# Patient Record
Sex: Male | Born: 1990 | Hispanic: Yes | Marital: Single | Smoking: Never smoker
Health system: Southern US, Community
[De-identification: ages and names within clinical notes are randomized; demographics above are authoritative.]

## PROBLEM LIST (undated history)

## (undated) DIAGNOSIS — R0600 Dyspnea, unspecified: Secondary | ICD-10-CM

## (undated) DIAGNOSIS — E0590 Thyrotoxicosis, unspecified without thyrotoxic crisis or storm: Secondary | ICD-10-CM

## (undated) HISTORY — PX: NO PAST SURGERIES: SHX2092

## (undated) HISTORY — DX: Thyrotoxicosis, unspecified without thyrotoxic crisis or storm: E05.90

---

## 2012-05-28 ENCOUNTER — Ambulatory Visit (INDEPENDENT_AMBULATORY_CARE_PROVIDER_SITE_OTHER): Payer: Self-pay | Admitting: Family Medicine

## 2012-05-28 VITALS — BP 142/78 | HR 105 | Temp 98.3°F | Resp 12 | Ht 68.5 in | Wt 176.0 lb

## 2012-05-28 DIAGNOSIS — R21 Rash and other nonspecific skin eruption: Secondary | ICD-10-CM

## 2012-05-28 DIAGNOSIS — L42 Pityriasis rosea: Secondary | ICD-10-CM

## 2012-05-28 LAB — POCT CBC
Granulocyte percent: 54.5 %G (ref 37–80)
MCV: 84.5 fL (ref 80–97)
MID (cbc): 0.7 (ref 0–0.9)
MPV: 10.4 fL (ref 0–99.8)
POC Granulocyte: 4 (ref 2–6.9)
POC LYMPH PERCENT: 36.7 %L (ref 10–50)
POC MID %: 8.8 %M (ref 0–12)
Platelet Count, POC: 413 10*3/uL (ref 142–424)
RDW, POC: 14.4 %

## 2012-05-28 LAB — POCT URINALYSIS DIPSTICK
Protein, UA: NEGATIVE
Spec Grav, UA: 1.02
Urobilinogen, UA: 0.2

## 2012-05-28 NOTE — Patient Instructions (Addendum)
So far your labs look fine.  I think that your rash is pityriasis rosea- a benign condition.  Let us know right away if you begin to feel ill, have a fever or any other complications.

## 2012-05-28 NOTE — Progress Notes (Addendum)
[facility] [address] [place] [address] [phone]  Date:  05/28/2012   Name:  Stephen Benson   DOB:  1990/08/09   MRN:  [id number]  PCP:  No primary provider on file.    Chief Complaint: Rash   History of Present Illness:  Stephen Benson is a 22 y.o. very pleasant male patient who presents with the following:  He is here today with a rash.  Today is Thursday. This past Saturday he was drinking some tequila and home- made moonshine out in his yard.  The next day he noted a rash on his feet, and then some other areas on his arms and trunk.  Most of the rash is not itchy- just one spot on his trunk is itchy.  No one else has the same rash, and he does not have any pets or any other new contacts that he can think of.  No one else seemed to become ill from the moonshine  Otherwise he is feeling ok He is generally healthy, although he has been told his BP was borderline about one year ago He is not taking any medications No major past medical history.    No fever, chills, or aches, or HA, no GI symptoms  No ST  There is no problem list on file for this patient.   History reviewed. No pertinent past medical history.  History reviewed. No pertinent past surgical history.  History  Substance Use Topics  . Smoking status: Never Smoker   . Smokeless tobacco: Not on file  . Alcohol Use: Yes    History reviewed. No pertinent family history.  Not on File  Medication list has been reviewed and updated.  No current outpatient prescriptions on file prior to visit.   No current facility-administered medications on file prior to visit.    Review of Systems:  As per HPI- otherwise negative.   Physical Examination: Filed Vitals:   05/28/12 1048  BP: 146/80  Pulse: 94  Temp: 98.3 F (36.8 C)  Resp: 12   Filed Vitals:   05/28/12 1048  Height: 5' 8.5" (1.74 m)  Weight: 176 lb (79.833 kg)   Body mass index is 26.37 kg/(m^2). Ideal Body  Weight: Weight in (lb) to have BMI = 25: 166.5  GEN: WDWN, NAD, Non-toxic, A & O x 3, appears well HEENT: Atraumatic, Normocephalic. Neck supple. No masses, No LAD.  Bilateral TM wnl, oropharynx normal.  PEERL,EOMI.   Ears and Nose: No external deformity. CV: RRR, No M/G/R. No JVD. No thrill. No extra heart sounds. PULM: CTA B, no wheezes, crackles, rhonchi. No retractions. No resp. distress. No accessory muscle use. ABD: S, NT, ND EXTR: No c/c/e NEURO Normal gait.  PSYCH: Normally interactive. Conversant. Not depressed or anxious appearing.  Calm demeanor.  Rash: on his trunk is a rash that appears typical of pityriasis rosea.  The palms and soles are spared.  On the dorsal feet are a few tiny petechiae and some flat, salmon macules also like pityriasis rosea.  No rash on the wrists  Results for orders placed in visit on 05/28/12  POCT CBC      Result Value Range   WBC 7.4  4.6 - 10.2 K/uL   Lymph, poc 2.7  0.6 - 3.4   POC LYMPH PERCENT 36.7  10 - 50 %L   MID (cbc) 0.7  0 - 0.9   POC MID % 8.8  0 - 12 %M   POC  Granulocyte 4.0  2 - 6.9   Granulocyte percent 54.5  37 - 80 %G   RBC 5.62  4.69 - 6.13 M/uL   Hemoglobin 16.0  14.1 - 18.1 g/dL   HCT, POC 40.9  81.1 - 53.7 %   MCV 84.5  80 - 97 fL   MCH, POC 28.5  27 - 31.2 pg   MCHC 33.7  31.8 - 35.4 g/dL   RDW, POC 91.4     Platelet Count, POC 413  142 - 424 K/uL   MPV 10.4  0 - 99.8 fL  POCT URINALYSIS DIPSTICK      Result Value Range   Color, UA yellow     Clarity, UA clear     Glucose, UA neg     Bilirubin, UA neg     Ketones, UA neg     Spec Grav, UA 1.020     Blood, UA neg     pH, UA 8.0     Protein, UA neg     Urobilinogen, UA 0.2     Nitrite, UA neg     Leukocytes, UA Negative      Assessment and Plan: Rash and nonspecific skin eruption - Plan: POCT CBC, POCT urinalysis dipstick, Comprehensive metabolic panel  Pityriasis rosea  Suspect that Brennon has a type of pityriasis rash today.  Labs so far are reassuring,  await CMP.  He is to let us know right away if he begins to feel ill in any way, and I will follow- up with him with his CMP probably tomorrow   He is aware that his BP is borderline today.  I have asked him to perform self- BP checks and let us know if he continues to run above normal   Signed Abbe Amsterdam, MD  Called on 3/21 regarding his CMP.  His alk phos is slightly elevated, which may be due to bony metabolism/ growth. Otherwise labs are normal, plan recheck alk phos in a few months, will send copy in the mail

## 2012-05-29 ENCOUNTER — Encounter: Payer: Self-pay | Admitting: Family Medicine

## 2012-05-29 LAB — COMPREHENSIVE METABOLIC PANEL
Alkaline Phosphatase: 159 U/L — ABNORMAL HIGH (ref 39–117)
BUN: 12 mg/dL (ref 6–23)
Glucose, Bld: 104 mg/dL — ABNORMAL HIGH (ref 70–99)
Sodium: 139 mEq/L (ref 135–145)
Total Bilirubin: 0.5 mg/dL (ref 0.3–1.2)
Total Protein: 7.5 g/dL (ref 6.0–8.3)

## 2015-06-05 ENCOUNTER — Other Ambulatory Visit (HOSPITAL_COMMUNITY): Payer: Self-pay | Admitting: Internal Medicine

## 2015-06-05 DIAGNOSIS — E0590 Thyrotoxicosis, unspecified without thyrotoxic crisis or storm: Secondary | ICD-10-CM

## 2015-06-19 ENCOUNTER — Encounter: Payer: Self-pay | Admitting: Internal Medicine

## 2015-06-21 ENCOUNTER — Encounter (HOSPITAL_COMMUNITY)
Admission: RE | Admit: 2015-06-21 | Discharge: 2015-06-21 | Disposition: A | Discharge status: Home / Self Care | Payer: Self-pay | Source: Ambulatory Visit | Attending: Internal Medicine | Admitting: Internal Medicine

## 2015-06-21 DIAGNOSIS — E0590 Thyrotoxicosis, unspecified without thyrotoxic crisis or storm: Secondary | ICD-10-CM | POA: Insufficient documentation

## 2015-06-22 ENCOUNTER — Encounter (HOSPITAL_COMMUNITY)
Admission: RE | Admit: 2015-06-22 | Discharge: 2015-06-22 | Disposition: A | Discharge status: Home / Self Care | Payer: Self-pay | Source: Ambulatory Visit | Attending: Internal Medicine | Admitting: Internal Medicine

## 2015-06-22 MED ORDER — SODIUM PERTECHNETATE TC 99M INJECTION
10.0000 | Freq: Once | INTRAVENOUS | Status: AC | PRN
  Administered 2015-06-22: 10 via INTRAVENOUS

## 2015-06-22 MED ORDER — SODIUM IODIDE I 131 CAPSULE
8.0000 | Freq: Once | INTRAVENOUS | Status: AC | PRN
  Administered 2015-06-21: 8 via ORAL

## 2015-06-27 ENCOUNTER — Encounter: Payer: Self-pay | Admitting: Endocrinology

## 2015-06-27 ENCOUNTER — Ambulatory Visit (INDEPENDENT_AMBULATORY_CARE_PROVIDER_SITE_OTHER): Payer: Self-pay | Admitting: Endocrinology

## 2015-06-27 VITALS — BP 132/80 | HR 85 | Temp 98.2°F | Ht 68.5 in | Wt 163.0 lb

## 2015-06-27 DIAGNOSIS — E0590 Thyrotoxicosis, unspecified without thyrotoxic crisis or storm: Secondary | ICD-10-CM

## 2015-06-27 MED ORDER — METHIMAZOLE 10 MG PO TABS: 40.0000 mg | ORAL_TABLET | Freq: Two times a day (BID) | ORAL | Status: DC

## 2015-06-27 NOTE — Progress Notes (Signed)
   Subjective:    Patient ID: Stephen Benson, male    DOB: 18-Mar-1990, 25 y.o.   MRN: [id number]  HPI Pt reports 1 year of slight tremor of the hands, and assoc weight loss.   Past Medical History  Diagnosis Date  . Primary hyperthyroidism     No past surgical history on file.  Social History   Social History  . Marital Status: Single    Spouse Name: N/A  . Number of Children: N/A  . Years of Education: N/A   Occupational History  . Not on file.   Social History Main Topics  . Smoking status: Never Smoker   . Smokeless tobacco: Not on file  . Alcohol Use: Yes  . Drug Use: Yes    Special: Marijuana  . Sexual Activity: Not on file   Other Topics Concern  . Not on file   Social History Narrative    No current outpatient prescriptions on file prior to visit.   No current facility-administered medications on file prior to visit.    No Known Allergies  Family History  Problem Relation Age of Onset  . Hyperthyroidism Other     BP 132/80 mmHg  Pulse 85  Temp(Src) 98.2 F (36.8 C) (Oral)  Ht 5' 8.5" (1.74 m)  Wt 163 lb (73.936 kg)  BMI 24.42 kg/m2  SpO2 98%  Review of Systems Diarrhea is improved.  He has fatigue and excessive diaphoresis.  denies headache, hoarseness, visual loss, palpitations, sob, polyuria, muscle weakness, anxiety, heat intolerance, easy bruising, and rhinorrhea.     Objective:   Physical Exam VS: see vs page GEN: no distress HEAD: head: no deformity eyes: moderate bilat periorbital swelling and proptosis external nose and ears are normal mouth: no lesion seen NECK: thyroid is 10 x normal size, diffuse CHEST WALL: no deformity LUNGS: clear to auscultation CV: reg rate and rhythm, no murmur ABD: abdomen is soft, nontender.  no hepatosplenomegaly.  not distended.  no hernia MUSCULOSKELETAL: muscle bulk and strength are grossly normal.  no obvious joint swelling.  gait is normal and steady EXTEMITIES: no deformity.  no edema PULSES:  no carotid bruit NEURO:  cn 2-12 grossly intact.   readily moves all 4's.  sensation is intact to touch on all 4's.  moderate tremor of the hands. SKIN:  Normal texture and temperature.  No rash or suspicious lesion is visible.  Slightly diaphoretic.  NODES:  None palpable at the neck PSYCH: alert, well-oriented.  Does not appear anxious nor depressed.   outside test results are reviewed: TSH < 0.006 T4=48 (4-12)  I have reviewed outside records, and summarized: Pt was noted to have hyperthyroidism, and referred here.     Assessment & Plan:  Hyperthyroidism, new, due to Grave's dz.  We discussed rx options.  he declines I-131 rx, due to cost.   Patient is advised the following: Patient Instructions  i have sent a prescription to your pharmacy, to increase the methimazole.   if ever you have fever while taking methimazole, stop it and call us, even if the reason is obvious, because of the risk of a rare side-effect. Please continue the same propranolol for now.  Please come back for a follow-up appointment in 1 month.  We can reconsider the radioactive iodine in the future if you want.

## 2015-06-27 NOTE — Patient Instructions (Addendum)
i have sent a prescription to your pharmacy, to increase the methimazole.   if ever you have fever while taking methimazole, stop it and call us, even if the reason is obvious, because of the risk of a rare side-effect. Please continue the same propranolol for now.  Please come back for a follow-up appointment in 1 month.  We can reconsider the radioactive iodine in the future if you want.

## 2015-06-28 ENCOUNTER — Encounter: Payer: Self-pay | Admitting: Endocrinology

## 2015-07-27 ENCOUNTER — Ambulatory Visit (INDEPENDENT_AMBULATORY_CARE_PROVIDER_SITE_OTHER): Payer: Self-pay | Admitting: Endocrinology

## 2015-07-27 ENCOUNTER — Encounter: Payer: Self-pay | Admitting: Endocrinology

## 2015-07-27 VITALS — BP 150/80 | HR 87 | Wt 175.6 lb

## 2015-07-27 DIAGNOSIS — E0590 Thyrotoxicosis, unspecified without thyrotoxic crisis or storm: Secondary | ICD-10-CM

## 2015-07-27 LAB — TSH: TSH: 0.05 u[IU]/mL — AB (ref 0.35–4.50)

## 2015-07-27 LAB — T4, FREE: FREE T4: 2.02 ng/dL — AB (ref 0.60–1.60)

## 2015-07-27 NOTE — Patient Instructions (Addendum)
blood tests are requested for you today.  We'll let you know about the results.   if ever you have fever while taking methimazole, stop it and call us, even if the reason is obvious, because of the risk of a rare side-effect. Please continue the same propranolol to 1/2 pill, twice a day Please come back for a follow-up appointment in 2 months.  We can reconsider the radioactive iodine in the future if you want.

## 2015-07-27 NOTE — Progress Notes (Signed)
   Subjective:    Patient ID: Author Slaven, male    DOB: 09-24-1990, 25 y.o.   MRN: [id number]  HPI Pt returns for f/u of hyperthyroidism (due to [place]; dx'ed 2017; he declines I-131 rx, due to cost; nuc med scan in 2017 showed high uptake and diffuse uptake; he was rx'ed tapazole and inderal).  Since on meds pt states he feels well in general.  He takes inderal just qd Past Medical History  Diagnosis Date  . Primary hyperthyroidism     No past surgical history on file.  Social History   Social History  . Marital Status: Single    Spouse Name: N/A  . Number of Children: N/A  . Years of Education: N/A   Occupational History  . Not on file.   Social History Main Topics  . Smoking status: Never Smoker   . Smokeless tobacco: Not on file  . Alcohol Use: Yes  . Drug Use: Yes    Special: Marijuana  . Sexual Activity: Not on file   Other Topics Concern  . Not on file   Social History Narrative    Current Outpatient Prescriptions on File Prior to Visit  Medication Sig Dispense Refill  . methimazole (TAPAZOLE) 10 MG tablet Take 4 tablets (40 mg total) by mouth 2 (two) times daily. 240 tablet 3  . propranolol (INDERAL) 60 MG tablet Take 30 mg by mouth 2 (two) times daily.      No current facility-administered medications on file prior to visit.    No Known Allergies  Family History  Problem Relation Age of Onset  . Hyperthyroidism Other     BP 150/80 mmHg  Pulse 87  Wt 175 lb 9.6 oz (79.652 kg)  SpO2 98%   Review of Systems Denies fever    Objective:   Physical Exam VITAL SIGNS:  See vs page GENERAL: no distress Neuro: slight tremor of the hands    Lab Results  Component Value Date   TSH 0.05* 07/27/2015       Assessment & Plan:  Hyperthyroidism, persistent  Patient is advised the following: Patient Instructions  blood tests are requested for you today.  We'll let you know about the results.   if ever you have fever while taking methimazole,  stop it and call us, even if the reason is obvious, because of the risk of a rare side-effect. Please continue the same propranolol to 1/2 pill, twice a day Please come back for a follow-up appointment in 2 months.  We can reconsider the radioactive iodine in the future if you want.

## 2015-07-28 ENCOUNTER — Encounter: Payer: Self-pay | Admitting: *Deleted

## 2015-08-02 ENCOUNTER — Encounter: Payer: Self-pay | Admitting: Endocrinology

## 2015-09-14 ENCOUNTER — Other Ambulatory Visit: Payer: Self-pay | Admitting: Endocrinology

## 2015-09-26 ENCOUNTER — Encounter: Payer: Self-pay | Admitting: Endocrinology

## 2015-09-26 ENCOUNTER — Ambulatory Visit (INDEPENDENT_AMBULATORY_CARE_PROVIDER_SITE_OTHER): Payer: Self-pay | Admitting: Endocrinology

## 2015-09-26 VITALS — BP 148/78 | HR 89 | Temp 98.6°F | Ht 68.5 in | Wt 197.8 lb

## 2015-09-26 DIAGNOSIS — E0590 Thyrotoxicosis, unspecified without thyrotoxic crisis or storm: Secondary | ICD-10-CM

## 2015-09-26 LAB — TSH: TSH: 0.05 u[IU]/mL — ABNORMAL LOW (ref 0.35–4.50)

## 2015-09-26 LAB — T4, FREE: Free T4: 0.59 ng/dL — ABNORMAL LOW (ref 0.60–1.60)

## 2015-09-26 MED ORDER — PROPRANOLOL HCL 60 MG PO TABS: 30.0000 mg | ORAL_TABLET | Freq: Two times a day (BID) | ORAL | Status: DC

## 2015-09-26 NOTE — Patient Instructions (Addendum)
blood tests are requested for you today.  We'll let you know about the results.   Based on the results, I can request the radioactive iodine pill for you.  It works like this: We would first check a thyroid "scan" (a special, but easy and painless type of thyroid x ray test).  you go to the x-ray department of the hospital to swallow a pill, which contains a miniscule amount of radiation.  You will not notice any symptoms from this.  You will go back to the x-ray department the next day, to lie down in front of a camera.  The results of this will be sent to me.   Based on the results, i hope to order for you a treatment pill of radioactive iodine.  Although it is a larger amount of radiation, you will again notice no symptoms from this.  The pill is gone from your body in a few days (during which you should stay away from other people), but takes several months to work.  Therefore, please return here approximately 6-8 weeks after the treatment.  This treatment has been available for many years, and the only known side-effect is an underactive thyroid.  It is possible that i would eventually prescribe for you a thyroid hormone pill, which is very inexpensive.  You don't have to worry about side-effects of this thyroid hormone pill, because it is the same molecule your thyroid makes. Please continue the same propranolol.

## 2015-09-26 NOTE — Progress Notes (Signed)
Subjective:    Patient ID: Stephen Benson, male    DOB: 10-20-1990, 25 y.o.   MRN: [id number]  HPI Pt returns for f/u of hyperthyroidism (due to [place]; dx'ed 2017; he declines RAI, due to cost; nuc med scan in 2017 showed high uptake and diffuse uptake; he was rx'ed tapazole and inderal).  Since on meds pt states he feels well in general, except for slight tremor.  He takes meds as rx'ed.  He says he now wants to consider RAI. Past Medical History  Diagnosis Date  . Primary hyperthyroidism     No past surgical history on file.  Social History   Social History  . Marital Status: Single    Spouse Name: N/A  . Number of Children: N/A  . Years of Education: N/A   Occupational History  . Not on file.   Social History Main Topics  . Smoking status: Never Smoker   . Smokeless tobacco: Not on file  . Alcohol Use: Yes  . Drug Use: Yes    Special: Marijuana  . Sexual Activity: Not on file   Other Topics Concern  . Not on file   Social History Narrative    No current outpatient prescriptions on file prior to visit.   No current facility-administered medications on file prior to visit.    No Known Allergies  Family History  Problem Relation Age of Onset  . Hyperthyroidism Other     BP 148/78 mmHg  Pulse 89  Temp(Src) 98.6 F (37 C) (Oral)  Ht 5' 8.5" (1.74 m)  Wt 197 lb 12.8 oz (89.721 kg)  BMI 29.63 kg/m2  SpO2 99%   Review of Systems Denies fever    Objective:   Physical Exam VITAL SIGNS:  See vs page GENERAL: no distress eyes: there is periorbital swelling, and bilat proptosis. NECK: thyroid is 10 x normal size, diffuse.    Lab Results  Component Value Date   TSH 0.05* 09/26/2015      Assessment & Plan:  Hyperthyroidism: much better.  He chooses RAI rx  Patient is advised the following: Patient Instructions  blood tests are requested for you today.  We'll let you know about the results.   Based on the results, I can request the radioactive  iodine pill for you.  It works like this: We would first check a thyroid "scan" (a special, but easy and painless type of thyroid x ray test).  you go to the x-ray department of the hospital to swallow a pill, which contains a miniscule amount of radiation.  You will not notice any symptoms from this.  You will go back to the x-ray department the next day, to lie down in front of a camera.  The results of this will be sent to me.   Based on the results, i hope to order for you a treatment pill of radioactive iodine.  Although it is a larger amount of radiation, you will again notice no symptoms from this.  The pill is gone from your body in a few days (during which you should stay away from other people), but takes several months to work.  Therefore, please return here approximately 6-8 weeks after the treatment.  This treatment has been available for many years, and the only known side-effect is an underactive thyroid.  It is possible that i would eventually prescribe for you a thyroid hormone pill, which is very inexpensive.  You don't have to worry about side-effects of this thyroid  hormone pill, because it is the same molecule your thyroid makes. Please continue the same propranolol.  Renato Shin, MD

## 2015-09-27 ENCOUNTER — Telehealth: Payer: Self-pay

## 2015-09-27 NOTE — Telephone Encounter (Signed)
Tried to reach patient the number is out of service; attempted to contact his mother; no answer. Will try again later.

## 2015-10-10 ENCOUNTER — Encounter (HOSPITAL_COMMUNITY)
Admission: RE | Admit: 2015-10-10 | Discharge: 2015-10-10 | Disposition: A | Discharge status: Home / Self Care | Payer: Self-pay | Source: Ambulatory Visit | Attending: Endocrinology | Admitting: Endocrinology

## 2015-10-10 ENCOUNTER — Telehealth: Payer: Self-pay | Admitting: Internal Medicine

## 2015-10-10 DIAGNOSIS — E0590 Thyrotoxicosis, unspecified without thyrotoxic crisis or storm: Secondary | ICD-10-CM | POA: Insufficient documentation

## 2015-10-10 NOTE — Telephone Encounter (Signed)
Kreg Shropshire w/[facility] would like to know if the doctor wants to repeat the NMX Thyroid Uptake/Imaging since the patient had one on 06/22/15 and they usually don't do them before 6 months.  Please advise.

## 2015-10-10 NOTE — Telephone Encounter (Signed)
No need to repeat unless Dr. Loanne Drilling requests again

## 2015-10-10 NOTE — Telephone Encounter (Signed)
See note below, Could you please advise during Dr. Loanne Drilling absence? Thanks!

## 2015-10-10 NOTE — Telephone Encounter (Signed)
Dr. Loanne Drilling did request repeat. [facility] called office back and stated they received the information from Dr. Loanne Drilling requesting the repeat of the thyroid up take/ imaging to be repeated. No further action needed at this time.

## 2015-10-11 ENCOUNTER — Other Ambulatory Visit: Payer: Self-pay | Admitting: Endocrinology

## 2015-10-11 ENCOUNTER — Encounter (HOSPITAL_COMMUNITY)
Admission: RE | Admit: 2015-10-11 | Discharge: 2015-10-11 | Disposition: A | Discharge status: Home / Self Care | Payer: Self-pay | Source: Ambulatory Visit | Attending: Endocrinology | Admitting: Endocrinology

## 2015-10-11 DIAGNOSIS — E0590 Thyrotoxicosis, unspecified without thyrotoxic crisis or storm: Secondary | ICD-10-CM

## 2015-10-12 ENCOUNTER — Telehealth: Payer: Self-pay

## 2015-10-12 NOTE — Telephone Encounter (Signed)
Called and spoke with patient about medication that was sent in. Advised patient they would be calling him for an appointment to schedule that. Also advised patient to call back and make an appointment for here 6-8 weeks after the test was run. Patient had no questions or concerns at this time.

## 2015-10-20 ENCOUNTER — Telehealth: Payer: Self-pay | Admitting: Endocrinology

## 2015-10-20 ENCOUNTER — Encounter (HOSPITAL_COMMUNITY): Payer: Self-pay

## 2015-10-20 DIAGNOSIS — E0590 Thyrotoxicosis, unspecified without thyrotoxic crisis or storm: Secondary | ICD-10-CM

## 2015-10-20 NOTE — Telephone Encounter (Signed)
To be advised.  

## 2015-10-20 NOTE — Telephone Encounter (Signed)
Pt calling to let us know that he is going to cancel his rai treatment and is going to proceed with thyroid surgery

## 2015-10-22 NOTE — Addendum Note (Signed)
Addended by: Renato Shin on: 10/22/2015 03:13 PM   Modules accepted: Orders

## 2015-10-22 NOTE — Telephone Encounter (Signed)
please call patient: I requested appt with surg.  you will receive a phone call, about a day and time for an appointment

## 2015-10-23 NOTE — Telephone Encounter (Signed)
I contacted the pt and advised of note below via vm. Requested a call back from the pt to discuss if need be.

## 2015-10-31 ENCOUNTER — Ambulatory Visit: Payer: Self-pay | Admitting: General Surgery

## 2015-10-31 NOTE — H&P (Signed)
History of Present Illness Ralene Ok MD; 10/31/2015 9:07 AM) The patient is a 25 year old male who presents with hyperthyroidism. Patient is a 25 year old male who is referred by Dr. Renato Shin for evaluation of Graves' disease. Patient was recently diagnosed with hypothyroidism. He was given methimazole. He states that his main symptom was weight loss. This stabilized with the methimazole. He does state that he has hypertension. He also states he does have some tremors. He is currently on propranolol for hypertension. Patient was offered radioactive iodine however declined and wished to proceed with surgery.   Past Surgical History Marjean Donna, [place]; 10/31/2015 8:36 AM) No pertinent past surgical history  Diagnostic Studies History Marjean Donna, CMA; 10/31/2015 8:36 AM) Colonoscopy never  Allergies Davy Pique Bynum, CMA; 10/31/2015 8:37 AM) No Known Drug Allergies08/22/2017  Medication History Davy Pique Bynum, CMA; 10/31/2015 8:37 AM) Propranolol HCl ER (60MG  Capsule ER 24HR, Oral) Active. Medications Reconciled  Social History Marjean Donna, CMA; 10/31/2015 8:36 AM) Alcohol use Occasional alcohol use. Caffeine use Tea. No drug use Tobacco use Never smoker.  Family History Marjean Donna, [place]; 10/31/2015 8:36 AM) Prostate Cancer Family Members In General. Thyroid problems Family Members In General.    Review of Systems Davy Pique Bynum CMA; 10/31/2015 8:36 AM) General Present- Fatigue and Night Sweats. Not Present- Appetite Loss, Chills, Fever, Weight Gain and Weight Loss. Skin Not Present- Change in Wart/Mole, Dryness, Hives, Jaundice, New Lesions, Non-Healing Wounds, Rash and Ulcer. HEENT Present- Nose Bleed and Wears glasses/contact lenses. Not Present- Earache, Hearing Loss, Hoarseness, Oral Ulcers, Ringing in the Ears, Seasonal Allergies, Sinus Pain, Sore Throat, Visual Disturbances and Yellow Eyes. Respiratory Present- Snoring. Not Present- Bloody sputum, Chronic Cough,  Difficulty Breathing and Wheezing. Breast Not Present- Breast Mass, Breast Pain, Nipple Discharge and Skin Changes. Cardiovascular Present- Rapid Heart Rate and Shortness of Breath. Not Present- Chest Pain, Difficulty Breathing Lying Down, Leg Cramps, Palpitations and Swelling of Extremities. Gastrointestinal Not Present- Abdominal Pain, Bloating, Bloody Stool, Change in Bowel Habits, Chronic diarrhea, Constipation, Difficulty Swallowing, Excessive gas, Gets full quickly at meals, Hemorrhoids, Indigestion, Nausea, Rectal Pain and Vomiting. Male Genitourinary Not Present- Blood in Urine, Change in Urinary Stream, Frequency, Impotence, Nocturia, Painful Urination, Urgency and Urine Leakage. Musculoskeletal Not Present- Back Pain, Joint Pain, Joint Stiffness, Muscle Pain, Muscle Weakness and Swelling of Extremities. Neurological Present- Headaches and Tingling. Not Present- Decreased Memory, Fainting, Numbness, Seizures, Tremor, Trouble walking and Weakness. Psychiatric Present- Anxiety. Not Present- Bipolar, Change in Sleep Pattern, Depression, Fearful and Frequent crying. Endocrine Not Present- Cold Intolerance, Excessive Hunger, Hair Changes, Heat Intolerance, Hot flashes and New Diabetes. Hematology Not Present- Blood Thinners, Easy Bruising, Excessive bleeding, Gland problems, HIV and Persistent Infections.  Vitals (Sonya Bynum CMA; 10/31/2015 8:37 AM) 10/31/2015 8:37 AM Weight: 190 lb Height: 68in Body Surface Area: 2 m Body Mass Index: 28.89 kg/m  Temp.: 15F(Temporal)  Pulse: 79 (Regular)  BP: 126/80 (Sitting, Left Arm, Standard)       Physical Exam Ralene Ok MD; 10/31/2015 9:07 AM) General Mental Status-Alert. General Appearance-Consistent with stated age. Hydration-Well hydrated. Voice-Normal.  Head and Neck Thyroid Gland Characteristics - enlarged, non-tender, soft.  Chest and Lung Exam Chest and lung exam reveals -quiet, even and easy  respiratory effort with no use of accessory muscles and on auscultation, normal breath sounds, no adventitious sounds and normal vocal resonance. Inspection Chest Wall - Normal. Back - normal.  Cardiovascular Cardiovascular examination reveals -normal heart sounds, regular rate and rhythm with no murmurs and normal pedal pulses bilaterally.  Abdomen Inspection Inspection of the abdomen reveals - No Hernias. Skin - Scar - no surgical scars. Palpation/Percussion Palpation and Percussion of the abdomen reveal - Soft, Non Tender, No Rebound tenderness, No Rigidity (guarding) and No hepatosplenomegaly. Auscultation Auscultation of the abdomen reveals - Bowel sounds normal.    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 1. The patient like to proceed the operative for total thyroidectomy. I did discuss with them that the patient will require Synthroid lifetime. 2. I discussed with him the risks and benefits of the procedure to include but not limited to: Infection, bleeding, damage to structures, possible need for further surgery. The patient was understanding and wished to proceed.

## 2015-11-28 ENCOUNTER — Ambulatory Visit: Payer: Self-pay | Admitting: Endocrinology

## 2015-12-19 ENCOUNTER — Ambulatory Visit: Payer: Self-pay | Admitting: Endocrinology

## 2015-12-19 DIAGNOSIS — Z0289 Encounter for other administrative examinations: Secondary | ICD-10-CM

## 2016-01-04 ENCOUNTER — Encounter (HOSPITAL_COMMUNITY): Payer: Self-pay | Admitting: *Deleted

## 2016-01-05 ENCOUNTER — Encounter (HOSPITAL_COMMUNITY)
Admission: RE | Disposition: A | Discharge status: Home / Self Care | Payer: Self-pay | Source: Ambulatory Visit | Attending: General Surgery

## 2016-01-05 ENCOUNTER — Ambulatory Visit (HOSPITAL_COMMUNITY): Payer: Self-pay | Admitting: Registered Nurse

## 2016-01-05 ENCOUNTER — Inpatient Hospital Stay (HOSPITAL_COMMUNITY)
Admission: RE | Admit: 2016-01-05 | Discharge: 2016-01-08 | DRG: 625 | Disposition: A | Discharge status: Home / Self Care | Payer: Self-pay | Source: Ambulatory Visit | Attending: General Surgery | Admitting: General Surgery

## 2016-01-05 ENCOUNTER — Observation Stay (HOSPITAL_COMMUNITY): Payer: Self-pay

## 2016-01-05 ENCOUNTER — Observation Stay (HOSPITAL_COMMUNITY): Payer: Self-pay | Admitting: Certified Registered"

## 2016-01-05 ENCOUNTER — Ambulatory Visit (HOSPITAL_COMMUNITY): Payer: Self-pay

## 2016-01-05 ENCOUNTER — Encounter (HOSPITAL_COMMUNITY): Payer: Self-pay | Admitting: *Deleted

## 2016-01-05 DIAGNOSIS — I469 Cardiac arrest, cause unspecified: Secondary | ICD-10-CM | POA: Diagnosis not present

## 2016-01-05 DIAGNOSIS — Y838 Other surgical procedures as the cause of abnormal reaction of the patient, or of later complication, without mention of misadventure at the time of the procedure: Secondary | ICD-10-CM | POA: Diagnosis present

## 2016-01-05 DIAGNOSIS — J398 Other specified diseases of upper respiratory tract: Secondary | ICD-10-CM | POA: Diagnosis present

## 2016-01-05 DIAGNOSIS — Z9089 Acquired absence of other organs: Secondary | ICD-10-CM

## 2016-01-05 DIAGNOSIS — Z978 Presence of other specified devices: Secondary | ICD-10-CM

## 2016-01-05 DIAGNOSIS — Z01811 Encounter for preprocedural respiratory examination: Secondary | ICD-10-CM

## 2016-01-05 DIAGNOSIS — J9600 Acute respiratory failure, unspecified whether with hypoxia or hypercapnia: Secondary | ICD-10-CM

## 2016-01-05 DIAGNOSIS — E890 Postprocedural hypothyroidism: Secondary | ICD-10-CM

## 2016-01-05 DIAGNOSIS — E0500 Thyrotoxicosis with diffuse goiter without thyrotoxic crisis or storm: Principal | ICD-10-CM | POA: Diagnosis present

## 2016-01-05 DIAGNOSIS — I10 Essential (primary) hypertension: Secondary | ICD-10-CM | POA: Diagnosis present

## 2016-01-05 DIAGNOSIS — R092 Respiratory arrest: Secondary | ICD-10-CM

## 2016-01-05 DIAGNOSIS — E89820 Postprocedural hematoma of an endocrine system organ or structure following an endocrine system procedure: Secondary | ICD-10-CM | POA: Diagnosis not present

## 2016-01-05 DIAGNOSIS — Z789 Other specified health status: Secondary | ICD-10-CM

## 2016-01-05 DIAGNOSIS — E039 Hypothyroidism, unspecified: Secondary | ICD-10-CM | POA: Diagnosis present

## 2016-01-05 DIAGNOSIS — Z8042 Family history of malignant neoplasm of prostate: Secondary | ICD-10-CM

## 2016-01-05 DIAGNOSIS — J9690 Respiratory failure, unspecified, unspecified whether with hypoxia or hypercapnia: Secondary | ICD-10-CM

## 2016-01-05 HISTORY — PX: MASS BIOPSY: SHX5445

## 2016-01-05 HISTORY — DX: Dyspnea, unspecified: R06.00

## 2016-01-05 HISTORY — PX: THYROIDECTOMY: SHX17

## 2016-01-05 LAB — BASIC METABOLIC PANEL
Anion gap: 9 (ref 5–15)
BUN: 7 mg/dL (ref 6–20)
CO2: 23 mmol/L (ref 22–32)
CREATININE: 0.58 mg/dL — AB (ref 0.61–1.24)
Calcium: 8.8 mg/dL — ABNORMAL LOW (ref 8.9–10.3)
Chloride: 107 mmol/L (ref 101–111)
GFR calc Af Amer: >60 mL/min (ref >60)
Glucose, Bld: 137 mg/dL — ABNORMAL HIGH (ref 65–99)
POTASSIUM: 4.2 mmol/L (ref 3.5–5.1)
SODIUM: 139 mmol/L (ref 135–145)

## 2016-01-05 LAB — COMPREHENSIVE METABOLIC PANEL
ALT: 36 U/L (ref 17–63)
AST: 26 U/L (ref 15–41)
Albumin: 4.2 g/dL (ref 3.5–5.0)
Alkaline Phosphatase: 150 U/L — ABNORMAL HIGH (ref 38–126)
Anion gap: 10 (ref 5–15)
BILIRUBIN TOTAL: 1.1 mg/dL (ref 0.3–1.2)
BUN: 6 mg/dL (ref 6–20)
CHLORIDE: 106 mmol/L (ref 101–111)
CO2: 23 mmol/L (ref 22–32)
CREATININE: 0.49 mg/dL — AB (ref 0.61–1.24)
Calcium: 9.7 mg/dL (ref 8.9–10.3)
Glucose, Bld: 97 mg/dL (ref 65–99)
POTASSIUM: 3.6 mmol/L (ref 3.5–5.1)
Sodium: 139 mmol/L (ref 135–145)
TOTAL PROTEIN: 7 g/dL (ref 6.5–8.1)

## 2016-01-05 LAB — CBC
HCT: 43.4 % (ref 39.0–52.0)
HCT: 37.6 % — ABNORMAL LOW (ref 39.0–52.0)
Hemoglobin: 15.6 g/dL (ref 13.0–17.0)
Hemoglobin: 13.2 g/dL (ref 13.0–17.0)
MCH: 28.2 pg (ref 26.0–34.0)
MCH: 27.6 pg (ref 26.0–34.0)
MCHC: 35.9 g/dL (ref 30.0–36.0)
MCHC: 35.1 g/dL (ref 30.0–36.0)
MCV: 78.3 fL (ref 78.0–100.0)
MCV: 78.7 fL (ref 78.0–100.0)
PLATELETS: 339 10*3/uL (ref 150–400)
PLATELETS: 328 10*3/uL (ref 150–400)
RBC: 5.54 MIL/uL (ref 4.22–5.81)
RBC: 4.78 MIL/uL (ref 4.22–5.81)
RDW: 13.9 % (ref 11.5–15.5)
RDW: 14 % (ref 11.5–15.5)
WBC: 6.4 10*3/uL (ref 4.0–10.5)
WBC: 23.7 10*3/uL — ABNORMAL HIGH (ref 4.0–10.5)

## 2016-01-05 LAB — PROTIME-INR
INR: 1.19
Prothrombin Time: 15.1 seconds (ref 11.4–15.2)

## 2016-01-05 LAB — APTT: aPTT: 27 seconds (ref 24–36)

## 2016-01-05 LAB — TRIGLYCERIDES: TRIGLYCERIDES: 101 mg/dL (ref <150)

## 2016-01-05 SURGERY — BIOPSY, MASS, NECK
Anesthesia: General | Site: Neck

## 2016-01-05 SURGERY — THYROIDECTOMY
Anesthesia: General | Site: Neck

## 2016-01-05 MED ORDER — MIDAZOLAM HCL 5 MG/5ML IJ SOLN
INTRAMUSCULAR | Status: DC | PRN
  Administered 2016-01-05: 2 mg via INTRAVENOUS

## 2016-01-05 MED ORDER — CEFAZOLIN SODIUM-DEXTROSE 2-4 GM/100ML-% IV SOLN
2.0000 g | Freq: Three times a day (TID) | INTRAVENOUS | Status: DC
  Administered 2016-01-06 – 2016-01-08 (×8): 2 g via INTRAVENOUS
  Filled 2016-01-05 (×10): qty 100

## 2016-01-05 MED ORDER — PROPOFOL 10 MG/ML IV BOLUS
INTRAVENOUS | Status: AC
  Filled 2016-01-05: qty 20

## 2016-01-05 MED ORDER — 0.9 % SODIUM CHLORIDE (POUR BTL) OPTIME
TOPICAL | Status: DC | PRN
  Administered 2016-01-05: 1000 mL

## 2016-01-05 MED ORDER — CHLORHEXIDINE GLUCONATE 0.12% ORAL RINSE (MEDLINE KIT)
15.0000 mL | Freq: Two times a day (BID) | OROMUCOSAL | Status: DC
  Administered 2016-01-05 – 2016-01-06 (×2): 15 mL via OROMUCOSAL

## 2016-01-05 MED ORDER — OXYCODONE-ACETAMINOPHEN 5-325 MG PO TABS: 1.0000 | ORAL_TABLET | ORAL | 0 refills | Status: DC | PRN

## 2016-01-05 MED ORDER — FENTANYL CITRATE (PF) 100 MCG/2ML IJ SOLN
100.0000 ug | INTRAMUSCULAR | Status: DC | PRN
  Administered 2016-01-06: 100 ug via INTRAVENOUS
  Filled 2016-01-05 (×2): qty 2

## 2016-01-05 MED ORDER — HEMOSTATIC AGENTS (NO CHARGE) OPTIME
TOPICAL | Status: DC | PRN
  Administered 2016-01-05: 1 via TOPICAL

## 2016-01-05 MED ORDER — CEFAZOLIN SODIUM 1 G IJ SOLR
INTRAMUSCULAR | Status: AC
  Filled 2016-01-05: qty 20

## 2016-01-05 MED ORDER — DEXMEDETOMIDINE HCL IN NACL 200 MCG/50ML IV SOLN
INTRAVENOUS | Status: AC
  Filled 2016-01-05: qty 50

## 2016-01-05 MED ORDER — FENTANYL CITRATE (PF) 100 MCG/2ML IJ SOLN
INTRAMUSCULAR | Status: DC | PRN
  Administered 2016-01-05 (×2): 100 ug via INTRAVENOUS

## 2016-01-05 MED ORDER — MICROFIBRILLAR COLL HEMOSTAT EX PADS: MEDICATED_PAD | CUTANEOUS | Status: DC | PRN

## 2016-01-05 MED ORDER — HYDROMORPHONE HCL 1 MG/ML IJ SOLN: 1.0000 mg | INTRAMUSCULAR | Status: DC | PRN

## 2016-01-05 MED ORDER — CHLORHEXIDINE GLUCONATE CLOTH 2 % EX PADS: 6.0000 | MEDICATED_PAD | Freq: Once | CUTANEOUS | Status: DC

## 2016-01-05 MED ORDER — ORAL CARE MOUTH RINSE
15.0000 mL | Freq: Four times a day (QID) | OROMUCOSAL | Status: DC
  Administered 2016-01-06 (×4): 15 mL via OROMUCOSAL

## 2016-01-05 MED ORDER — PROPOFOL 1000 MG/100ML IV EMUL
5.0000 ug/kg/min | INTRAVENOUS | Status: DC
  Filled 2016-01-05 (×2): qty 100

## 2016-01-05 MED ORDER — MIDAZOLAM HCL 2 MG/2ML IJ SOLN
INTRAMUSCULAR | Status: AC
  Filled 2016-01-05: qty 2

## 2016-01-05 MED ORDER — CALCIUM CARBONATE-VITAMIN D 500-200 MG-UNIT PO TABS
2.0000 | ORAL_TABLET | Freq: Three times a day (TID) | ORAL | Status: DC
  Administered 2016-01-06 – 2016-01-08 (×7): 2 via ORAL
  Filled 2016-01-05 (×9): qty 2

## 2016-01-05 MED ORDER — ROCURONIUM BROMIDE 100 MG/10ML IV SOLN
INTRAVENOUS | Status: DC | PRN
  Administered 2016-01-05 (×2): 20 mg via INTRAVENOUS
  Administered 2016-01-05: 50 mg via INTRAVENOUS

## 2016-01-05 MED ORDER — ONDANSETRON HCL 4 MG/2ML IJ SOLN
INTRAMUSCULAR | Status: AC
  Filled 2016-01-05: qty 2

## 2016-01-05 MED ORDER — PHENYLEPHRINE 40 MCG/ML (10ML) SYRINGE FOR IV PUSH (FOR BLOOD PRESSURE SUPPORT)
PREFILLED_SYRINGE | INTRAVENOUS | Status: AC
  Filled 2016-01-05: qty 10

## 2016-01-05 MED ORDER — LIDOCAINE HCL (CARDIAC) 20 MG/ML IV SOLN: INTRAVENOUS | Status: DC | PRN

## 2016-01-05 MED ORDER — FAMOTIDINE IN NACL 20-0.9 MG/50ML-% IV SOLN
20.0000 mg | Freq: Two times a day (BID) | INTRAVENOUS | Status: DC
Start: 2016-01-05 — End: 2016-01-08
  Administered 2016-01-05 – 2016-01-08 (×6): 20 mg via INTRAVENOUS
  Filled 2016-01-05 (×7): qty 50

## 2016-01-05 MED ORDER — ACETAMINOPHEN 10 MG/ML IV SOLN
INTRAVENOUS | Status: AC
  Filled 2016-01-05: qty 100

## 2016-01-05 MED ORDER — ONDANSETRON HCL 4 MG/2ML IJ SOLN
INTRAMUSCULAR | Status: DC | PRN
  Administered 2016-01-05: 4 mg via INTRAVENOUS

## 2016-01-05 MED ORDER — DEXMEDETOMIDINE HCL IN NACL 400 MCG/100ML IV SOLN
INTRAVENOUS | Status: DC | PRN
  Administered 2016-01-05: 0.7 ug/kg/h via INTRAVENOUS
  Administered 2016-01-05: .5 ug/kg/h via INTRAVENOUS

## 2016-01-05 MED ORDER — MAGNESIUM OXIDE -MG SUPPLEMENT 500 MG PO CAPS: 500.0000 mg | ORAL_CAPSULE | Freq: Two times a day (BID) | ORAL | 0 refills | Status: DC

## 2016-01-05 MED ORDER — CALCIUM CARBONATE-VITAMIN D 500-200 MG-UNIT PO TABS: 1.0000 | ORAL_TABLET | Freq: Three times a day (TID) | ORAL | 1 refills | Status: DC

## 2016-01-05 MED ORDER — HYDROMORPHONE HCL 2 MG/ML IJ SOLN
INTRAMUSCULAR | Status: AC
  Filled 2016-01-05: qty 1

## 2016-01-05 MED ORDER — LACTATED RINGERS IV SOLN
INTRAVENOUS | Status: DC | PRN
  Administered 2016-01-05: 20:00:00 via INTRAVENOUS

## 2016-01-05 MED ORDER — FENTANYL CITRATE (PF) 100 MCG/2ML IJ SOLN
INTRAMUSCULAR | Status: AC
  Filled 2016-01-05: qty 2

## 2016-01-05 MED ORDER — CEFAZOLIN SODIUM-DEXTROSE 2-3 GM-% IV SOLR
INTRAVENOUS | Status: DC | PRN
  Administered 2016-01-05: 2 g via INTRAVENOUS

## 2016-01-05 MED ORDER — SUGAMMADEX SODIUM 200 MG/2ML IV SOLN
INTRAVENOUS | Status: DC | PRN
  Administered 2016-01-05: 150 mg via INTRAVENOUS

## 2016-01-05 MED ORDER — 0.9 % SODIUM CHLORIDE (POUR BTL) OPTIME
TOPICAL | Status: DC | PRN
  Administered 2016-01-05 (×2): 1000 mL

## 2016-01-05 MED ORDER — HYDROMORPHONE HCL 1 MG/ML IJ SOLN
0.2500 mg | INTRAMUSCULAR | Status: DC | PRN
  Administered 2016-01-05: 0.5 mg via INTRAVENOUS

## 2016-01-05 MED ORDER — OXYCODONE HCL 5 MG PO TABS: 5.0000 mg | ORAL_TABLET | ORAL | Status: DC | PRN

## 2016-01-05 MED ORDER — DEXMEDETOMIDINE HCL IN NACL 200 MCG/50ML IV SOLN
INTRAVENOUS | Status: AC
Start: 2016-01-05 — End: 2016-01-05
  Filled 2016-01-05: qty 50

## 2016-01-05 MED ORDER — ONDANSETRON HCL 4 MG/2ML IJ SOLN: 4.0000 mg | Freq: Four times a day (QID) | INTRAMUSCULAR | Status: DC | PRN

## 2016-01-05 MED ORDER — FENTANYL CITRATE (PF) 100 MCG/2ML IJ SOLN
INTRAMUSCULAR | Status: AC
  Filled 2016-01-05: qty 4

## 2016-01-05 MED ORDER — LACTATED RINGERS IV SOLN
INTRAVENOUS | Status: DC
  Administered 2016-01-05 (×2): via INTRAVENOUS

## 2016-01-05 MED ORDER — DEXTROSE-NACL 5-0.9 % IV SOLN: INTRAVENOUS | Status: DC

## 2016-01-05 MED ORDER — FENTANYL CITRATE (PF) 100 MCG/2ML IJ SOLN
100.0000 ug | INTRAMUSCULAR | Status: DC | PRN
  Administered 2016-01-06: 100 ug via INTRAVENOUS

## 2016-01-05 MED ORDER — OXYCODONE HCL 5 MG PO TABS: 5.0000 mg | ORAL_TABLET | Freq: Once | ORAL | Status: DC | PRN

## 2016-01-05 MED ORDER — LABETALOL HCL 5 MG/ML IV SOLN
INTRAVENOUS | Status: AC
  Filled 2016-01-05: qty 4

## 2016-01-05 MED ORDER — LACTATED RINGERS IV SOLN
INTRAVENOUS | Status: DC
  Administered 2016-01-05 – 2016-01-06 (×2): via INTRAVENOUS

## 2016-01-05 MED ORDER — DEXAMETHASONE SODIUM PHOSPHATE 10 MG/ML IJ SOLN
INTRAMUSCULAR | Status: AC
  Filled 2016-01-05: qty 1

## 2016-01-05 MED ORDER — ROCURONIUM BROMIDE 10 MG/ML (PF) SYRINGE
PREFILLED_SYRINGE | INTRAVENOUS | Status: DC | PRN
  Administered 2016-01-05: 50 mg via INTRAVENOUS
  Administered 2016-01-05: 20 mg via INTRAVENOUS

## 2016-01-05 MED ORDER — BUPIVACAINE HCL 0.25 % IJ SOLN
INTRAMUSCULAR | Status: DC | PRN
  Administered 2016-01-05: 9 mL
  Administered 2016-01-05: 1 mL

## 2016-01-05 MED ORDER — LIDOCAINE 2% (20 MG/ML) 5 ML SYRINGE
INTRAMUSCULAR | Status: DC | PRN
  Administered 2016-01-05: 50 mg via INTRAVENOUS
  Administered 2016-01-05: 25 mg via INTRAVENOUS

## 2016-01-05 MED ORDER — PHENOL 1.4 % MT LIQD: 1.0000 | OROMUCOSAL | Status: DC | PRN

## 2016-01-05 MED ORDER — CEFAZOLIN SODIUM-DEXTROSE 2-4 GM/100ML-% IV SOLN
2.0000 g | INTRAVENOUS | Status: AC
  Administered 2016-01-05: 2 g via INTRAVENOUS
  Filled 2016-01-05: qty 100

## 2016-01-05 MED ORDER — HYDROMORPHONE HCL 2 MG/ML IJ SOLN: 1.0000 mg | INTRAMUSCULAR | Status: DC | PRN

## 2016-01-05 MED ORDER — LEVOTHYROXINE SODIUM 112 MCG PO TABS: 112.0000 ug | ORAL_TABLET | Freq: Every day | ORAL | 1 refills | Status: DC

## 2016-01-05 MED ORDER — SODIUM CHLORIDE 0.9 % IV SOLN
INTRAVENOUS | Status: DC | PRN
  Administered 2016-01-05: 20:00:00 via INTRAVENOUS

## 2016-01-05 MED ORDER — DEXMEDETOMIDINE HCL IN NACL 400 MCG/100ML IV SOLN
0.0000 ug/kg/h | INTRAVENOUS | Status: DC
  Administered 2016-01-05: 0.9 ug/kg/h via INTRAVENOUS
  Administered 2016-01-06: 0.7 ug/kg/h via INTRAVENOUS
  Administered 2016-01-06: 0.4 ug/kg/h via INTRAVENOUS
  Filled 2016-01-05 (×2): qty 100
  Filled 2016-01-05: qty 50

## 2016-01-05 MED ORDER — PHENYLEPHRINE HCL 10 MG/ML IJ SOLN
INTRAMUSCULAR | Status: DC | PRN
  Administered 2016-01-05: 40 ug via INTRAVENOUS

## 2016-01-05 MED ORDER — BUPIVACAINE HCL (PF) 0.25 % IJ SOLN
INTRAMUSCULAR | Status: AC
  Filled 2016-01-05: qty 30

## 2016-01-05 MED ORDER — CEFAZOLIN SODIUM-DEXTROSE 2-4 GM/100ML-% IV SOLN
2.0000 g | Freq: Three times a day (TID) | INTRAVENOUS | Status: DC
  Filled 2016-01-05: qty 100

## 2016-01-05 MED ORDER — FENTANYL CITRATE (PF) 100 MCG/2ML IJ SOLN
INTRAMUSCULAR | Status: DC | PRN
  Administered 2016-01-05: 100 ug via INTRAVENOUS
  Administered 2016-01-05: 50 ug via INTRAVENOUS
  Administered 2016-01-05: 100 ug via INTRAVENOUS
  Administered 2016-01-05: 50 ug via INTRAVENOUS

## 2016-01-05 MED ORDER — LABETALOL HCL 5 MG/ML IV SOLN
INTRAVENOUS | Status: DC | PRN
  Administered 2016-01-05: 5 mg via INTRAVENOUS

## 2016-01-05 MED ORDER — MAGNESIUM OXIDE 400 (241.3 MG) MG PO TABS
400.0000 mg | ORAL_TABLET | Freq: Two times a day (BID) | ORAL | Status: DC
  Filled 2016-01-05: qty 1

## 2016-01-05 MED ORDER — OXYCODONE HCL 5 MG/5ML PO SOLN: 5.0000 mg | Freq: Once | ORAL | Status: DC | PRN

## 2016-01-05 MED ORDER — ACETAMINOPHEN 10 MG/ML IV SOLN
INTRAVENOUS | Status: DC | PRN
  Administered 2016-01-05: 1000 mg via INTRAVENOUS

## 2016-01-05 MED ORDER — SUGAMMADEX SODIUM 200 MG/2ML IV SOLN
INTRAVENOUS | Status: AC
  Filled 2016-01-05: qty 2

## 2016-01-05 MED ORDER — PROPOFOL 10 MG/ML IV BOLUS
INTRAVENOUS | Status: DC | PRN
Start: 2016-01-05 — End: 2016-01-05
  Administered 2016-01-05: 100 mg via INTRAVENOUS
  Administered 2016-01-05: 20 mg via INTRAVENOUS
  Administered 2016-01-05: 200 mg via INTRAVENOUS

## 2016-01-05 MED ORDER — HYDRALAZINE HCL 20 MG/ML IJ SOLN: 10.0000 mg | INTRAMUSCULAR | Status: DC | PRN

## 2016-01-05 SURGICAL SUPPLY — 56 items
ATTRACTOMAT 16X20 MAGNETIC DRP (DRAPES) ×3 IMPLANT
BLADE SURG 15 STRL LF DISP TIS (BLADE) ×1 IMPLANT
BLADE SURG 15 STRL SS (BLADE) ×2
BLADE SURG ROTATE 9660 (MISCELLANEOUS) ×3 IMPLANT
CANISTER SUCTION 2500CC (MISCELLANEOUS) ×3 IMPLANT
CHLORAPREP W/TINT 26ML (MISCELLANEOUS) ×3 IMPLANT
CLIP TI MEDIUM 24 (CLIP) ×6 IMPLANT
CLIP TI WIDE RED SMALL 24 (CLIP) ×3 IMPLANT
CONT SPEC 4OZ CLIKSEAL STRL BL (MISCELLANEOUS) ×3 IMPLANT
COVER SURGICAL LIGHT HANDLE (MISCELLANEOUS) ×6 IMPLANT
CRADLE DONUT ADULT HEAD (MISCELLANEOUS) ×3 IMPLANT
DERMABOND ADVANCED (GAUZE/BANDAGES/DRESSINGS) ×2
DERMABOND ADVANCED .7 DNX12 (GAUZE/BANDAGES/DRESSINGS) ×1 IMPLANT
DRAPE LAPAROTOMY 100X72 PEDS (DRAPES) ×3 IMPLANT
DRAPE UTILITY XL STRL (DRAPES) ×3 IMPLANT
ELECT COATED BLADE 2.86 ST (ELECTRODE) ×3 IMPLANT
ELECT REM PT RETURN 9FT ADLT (ELECTROSURGICAL) ×3
ELECTRODE REM PT RTRN 9FT ADLT (ELECTROSURGICAL) ×1 IMPLANT
GAUZE SPONGE 4X4 12PLY STRL (GAUZE/BANDAGES/DRESSINGS) IMPLANT
GAUZE SPONGE 4X4 16PLY XRAY LF (GAUZE/BANDAGES/DRESSINGS) ×6 IMPLANT
GLOVE BIO SURGEON STRL SZ7.5 (GLOVE) ×3 IMPLANT
GLOVE BIOGEL PI IND STRL 6.5 (GLOVE) ×1 IMPLANT
GLOVE BIOGEL PI IND STRL 7.0 (GLOVE) ×1 IMPLANT
GLOVE BIOGEL PI INDICATOR 6.5 (GLOVE) ×2
GLOVE BIOGEL PI INDICATOR 7.0 (GLOVE) ×2
GLOVE ECLIPSE 7.0 STRL STRAW (GLOVE) ×3 IMPLANT
GLOVE ECLIPSE 7.5 STRL STRAW (GLOVE) ×3 IMPLANT
GLOVE EUDERMIC 7 POWDERFREE (GLOVE) ×3 IMPLANT
GLOVE SURG SS PI 6.5 STRL IVOR (GLOVE) ×6 IMPLANT
GOWN STRL REUS W/ TWL LRG LVL3 (GOWN DISPOSABLE) ×2 IMPLANT
GOWN STRL REUS W/ TWL XL LVL3 (GOWN DISPOSABLE) ×2 IMPLANT
GOWN STRL REUS W/TWL LRG LVL3 (GOWN DISPOSABLE) ×4
GOWN STRL REUS W/TWL XL LVL3 (GOWN DISPOSABLE) ×4
HEMOSTAT SURGICEL 2X4 FIBR (HEMOSTASIS) ×3 IMPLANT
KIT BASIN OR (CUSTOM PROCEDURE TRAY) ×3 IMPLANT
KIT ROOM TURNOVER OR (KITS) ×3 IMPLANT
NEEDLE HYPO 25GX1X1/2 BEV (NEEDLE) ×3 IMPLANT
NS IRRIG 1000ML POUR BTL (IV SOLUTION) ×3 IMPLANT
PACK SURGICAL SETUP 50X90 (CUSTOM PROCEDURE TRAY) ×3 IMPLANT
PAD ARMBOARD 7.5X6 YLW CONV (MISCELLANEOUS) ×3 IMPLANT
PENCIL BUTTON HOLSTER BLD 10FT (ELECTRODE) ×3 IMPLANT
SHEARS HARMONIC 9CM CVD (BLADE) ×3 IMPLANT
SPECIMEN JAR MEDIUM (MISCELLANEOUS) IMPLANT
SPONGE INTESTINAL PEANUT (DISPOSABLE) ×6 IMPLANT
SUT MNCRL AB 4-0 PS2 18 (SUTURE) ×3 IMPLANT
SUT SILK 2 0 (SUTURE)
SUT SILK 2-0 18XBRD TIE 12 (SUTURE) IMPLANT
SUT SILK 3 0 (SUTURE)
SUT SILK 3-0 18XBRD TIE 12 (SUTURE) IMPLANT
SUT VIC AB 3-0 SH 18 (SUTURE) ×6 IMPLANT
SYR BULB 3OZ (MISCELLANEOUS) ×3 IMPLANT
SYR CONTROL 10ML LL (SYRINGE) ×3 IMPLANT
TOWEL OR 17X24 6PK STRL BLUE (TOWEL DISPOSABLE) IMPLANT
TOWEL OR 17X26 10 PK STRL BLUE (TOWEL DISPOSABLE) ×3 IMPLANT
TUBE CONNECTING 12'X1/4 (SUCTIONS) ×1
TUBE CONNECTING 12X1/4 (SUCTIONS) ×2 IMPLANT

## 2016-01-05 SURGICAL SUPPLY — 60 items
ATTRACTOMAT 16X20 MAGNETIC DRP (DRAPES) IMPLANT
BENZOIN TINCTURE PRP APPL 2/3 (GAUZE/BANDAGES/DRESSINGS) ×3 IMPLANT
CANISTER SUCTION 2500CC (MISCELLANEOUS) ×3 IMPLANT
CHLORAPREP W/TINT 26ML (MISCELLANEOUS) IMPLANT
CLIP TI MEDIUM 24 (CLIP) ×3 IMPLANT
CLIP TI WIDE RED SMALL 24 (CLIP) ×3 IMPLANT
CONT SPEC 4OZ CLIKSEAL STRL BL (MISCELLANEOUS) IMPLANT
COVER SURGICAL LIGHT HANDLE (MISCELLANEOUS) ×3 IMPLANT
CRADLE DONUT ADULT HEAD (MISCELLANEOUS) IMPLANT
DERMABOND ADVANCED (GAUZE/BANDAGES/DRESSINGS)
DERMABOND ADVANCED .7 DNX12 (GAUZE/BANDAGES/DRESSINGS) IMPLANT
DRAIN CHANNEL 19F RND (DRAIN) ×6 IMPLANT
DRAPE LAPAROTOMY 100X72 PEDS (DRAPES) ×3 IMPLANT
DRAPE UTILITY XL STRL (DRAPES) ×3 IMPLANT
ELECT CAUTERY BLADE 6.4 (BLADE) IMPLANT
ELECT COATED BLADE 2.86 ST (ELECTRODE) ×3 IMPLANT
ELECT REM PT RETURN 9FT ADLT (ELECTROSURGICAL) ×3
ELECTRODE REM PT RTRN 9FT ADLT (ELECTROSURGICAL) ×1 IMPLANT
EVACUATOR SILICONE 100CC (DRAIN) ×6 IMPLANT
GAUZE SPONGE 4X4 12PLY STRL (GAUZE/BANDAGES/DRESSINGS) ×3 IMPLANT
GAUZE SPONGE 4X4 16PLY XRAY LF (GAUZE/BANDAGES/DRESSINGS) ×3 IMPLANT
GLOVE BIO SURGEON STRL SZ 6.5 (GLOVE) ×6 IMPLANT
GLOVE BIO SURGEONS STRL SZ 6.5 (GLOVE) ×3
GLOVE BIOGEL PI IND STRL 6.5 (GLOVE) ×1 IMPLANT
GLOVE BIOGEL PI INDICATOR 6.5 (GLOVE) ×2
GLOVE EUDERMIC 7 POWDERFREE (GLOVE) ×3 IMPLANT
GOWN STRL REUS W/ TWL LRG LVL3 (GOWN DISPOSABLE) ×1 IMPLANT
GOWN STRL REUS W/ TWL XL LVL3 (GOWN DISPOSABLE) IMPLANT
GOWN STRL REUS W/TWL LRG LVL3 (GOWN DISPOSABLE) ×2
GOWN STRL REUS W/TWL XL LVL3 (GOWN DISPOSABLE)
HEMOSTAT SURGICEL 2X4 FIBR (HEMOSTASIS) ×3 IMPLANT
KIT BASIN OR (CUSTOM PROCEDURE TRAY) ×3 IMPLANT
KIT REMOVER STAPLE SKIN (MISCELLANEOUS) ×3 IMPLANT
KIT ROOM TURNOVER OR (KITS) ×3 IMPLANT
NEEDLE HYPO 25X1 1.5 SAFETY (NEEDLE) IMPLANT
NS IRRIG 1000ML POUR BTL (IV SOLUTION) ×6 IMPLANT
PACK SURGICAL SETUP 50X90 (CUSTOM PROCEDURE TRAY) ×3 IMPLANT
PAD ARMBOARD 7.5X6 YLW CONV (MISCELLANEOUS) ×6 IMPLANT
PENCIL BUTTON HOLSTER BLD 10FT (ELECTRODE) ×3 IMPLANT
SHEARS HARMONIC 9CM CVD (BLADE) IMPLANT
SPECIMEN JAR MEDIUM (MISCELLANEOUS) IMPLANT
SPONGE INTESTINAL PEANUT (DISPOSABLE) ×6 IMPLANT
STAPLER VISISTAT 35W (STAPLE) ×3 IMPLANT
SUT ETHILON 3 0 PS 1 (SUTURE) ×6 IMPLANT
SUT MNCRL AB 4-0 PS2 18 (SUTURE) ×3 IMPLANT
SUT SILK 2 0 (SUTURE) ×2
SUT SILK 2-0 18XBRD TIE 12 (SUTURE) ×1 IMPLANT
SUT SILK 3 0 (SUTURE) ×2
SUT SILK 3-0 18XBRD TIE 12 (SUTURE) ×1 IMPLANT
SUT VIC AB 3-0 SH 18 (SUTURE) ×3 IMPLANT
SUT VIC AB 3-0 SH 8-18 (SUTURE) ×3 IMPLANT
SYR BULB 3OZ (MISCELLANEOUS) IMPLANT
SYR CONTROL 10ML LL (SYRINGE) IMPLANT
TAPE CLOTH SURG 4X10 WHT LF (GAUZE/BANDAGES/DRESSINGS) ×3 IMPLANT
TAPE PAPER 3X10 WHT MICROPORE (GAUZE/BANDAGES/DRESSINGS) ×3 IMPLANT
TOWEL OR 17X24 6PK STRL BLUE (TOWEL DISPOSABLE) ×6 IMPLANT
TOWEL OR 17X26 10 PK STRL BLUE (TOWEL DISPOSABLE) ×3 IMPLANT
TUBE CONNECTING 12'X1/4 (SUCTIONS) ×1
TUBE CONNECTING 12X1/4 (SUCTIONS) ×2 IMPLANT
YANKAUER SUCT BULB TIP NO VENT (SUCTIONS) ×3 IMPLANT

## 2016-01-05 NOTE — Code Documentation (Signed)
  Patient Name: Petey Island   MRN: [id number]   Date of Birth/ Sex: [birth year] , male      Admission Date: 01/05/2016  Attending Provider: Ralene Ok, MD  Primary Diagnosis: <principal problem not specified>   Indication: Pt was in his usual state of health until this PM, when he was noted to be in respiratory distress. Code blue was subsequently called. At the time of arrival on scene, ACLS protocol was underway.   Technical Description:  - CPR performance duration:  10 minutes  - Was defibrillation or cardioversion used? No   - Was external pacer placed? No  - Was patient intubated pre/post CPR? Yes   Medications Administered: Y = Yes; Blank = No Amiodarone    Atropine  y-1  Calcium    Epinephrine  y-1  Lidocaine    Magnesium    Norepinephrine    Phenylephrine    Sodium bicarbonate    Vasopressin     Post CPR evaluation:  - Final Status - Was patient successfully resuscitated ? Yes - What is current rhythm? Sinus rhythm  - What is current hemodynamic status? stable  Miscellaneous Information:  - Labs sent, including: BMET and Magnesium level  - Primary team notified?  Yes  - Family Notified? Yes  - Additional notes/ transfer status: Patient developed a hematoma post op for  thyroidectomy that occurred this afternoon.  Patient has been transferred to the OR after patient was resuscitated.     Valinda Party, DO  01/05/2016, 8:28 PM

## 2016-01-05 NOTE — Discharge Instructions (Signed)
Thyroidectomy, Care After °Refer to this sheet in the next few weeks. These instructions provide you with information about caring for yourself after your procedure. Your health care provider may also give you more specific instructions. Your treatment has been planned according to current medical practices, but problems sometimes occur. Call your health care provider if you have any problems or questions after your procedure. °WHAT TO EXPECT AFTER THE PROCEDURE °After your procedure, it is typical to have: °· Mild pain in the neck or upper body, especially when swallowing. °· A sore throat. °· A weak voice. °HOME CARE INSTRUCTIONS  °· Take medicines only as directed by your health care provider. °· If your entire thyroid gland was removed, you may need to take thyroid hormone medicine from now on. °· Do not take medicines that contain aspirin and ibuprofen until your health care provider says that you can. These medicines can increase your risk of bleeding. °· Some pain medicines cause constipation. Drink enough fluid to keep your urine clear or pale yellow. This can help to prevent constipation. °· Start slowly with eating. You may need to have only liquids and soft foods for a few days or as directed by your health care provider. °· Do not take baths, swim, or use a hot tub until your health care provider approves. °· There are many different ways to close and cover an incision, including stitches (sutures), skin glue, and adhesive strips. Follow your health care provider's instructions for: °¨ Incision care. °¨ Bandage (dressing) changes and removal. °¨ Incision closure removal. °· Resume your usual activities as directed by your health care provider. °· For the first 10 days after the procedure or as instructed by your health care provider: °¨ Do not lift anything heavier than 20 lb (9.1 kg). °¨ Do not jog, swim, or do other strenuous exercises. °¨ Do not play contact sports. °· Keep all follow-up visits as  directed by your health care provider. This is important. °SEEK MEDICAL CARE IF: °· The soreness in your throat gets worse. °· You have increased pain at your incision or incisions. °· You have increased bleeding from an incision. °· Your incision becomes infected. Watch for: °¨ Swelling. °¨ Redness. °¨ Warmth. °¨ Pus. °· You notice a bad smell coming from an incision or dressing. °· You have a fever. °· You feel lightheaded or faint. °· You have numbness, tingling, or muscle spasms in your: °¨ Arms. °¨ Hands. °¨ Feet. °¨ Face. °· You have trouble swallowing. °SEEK IMMEDIATE MEDICAL CARE IF:  °· You develop a rash. °· You have difficulty breathing. °· You hear whistling noises coming from your chest. °· You develop a cough that gets worse. °· Your speech changes, or you have hoarseness that gets worse. °  °This information is not intended to replace advice given to you by your health care provider. Make sure you discuss any questions you have with your health care provider. °  °Document Released: 09/14/2004 Document Revised: 03/18/2014 Document Reviewed: 07/28/2013 °Elsevier Interactive Patient Education ©2016 [employer] ° °

## 2016-01-05 NOTE — Op Note (Signed)
Patient Name:           Stephen Benson   Date of Surgery:        01/05/2016  Pre op Diagnosis:      Wound hemorrhage, postop total thyroidectomy  Post op Diagnosis:    Same  Procedure:               Neck wound exploration, control of bleeders, placement of drains  Surgeon:                     Edsel Petrin. Dalbert Batman, M.D., FACS  Assistant:                      OR staff   Indication for Assistant: N/A  Operative Indications:   This is a 25 year old man who was evaluated electively for Graves' disease and hyper thyroidism.  He was given methimazole.  He was evaluated by Dr. Renato Shin, endocrinologist.  He was on propranolol for hypertension.  He was offered radio active iodine ablation but declined that.  He was evaluated by Dr. Rosendo Gros.  He was brought to the operating room today by Dr. Rosendo Gros and underwent total thyroidectomy.  He developed postop wound hemorrhage and had a respiratory arrest on the floor.  Dr. Erroll Luna came to the bedside and supervised intubation,  open the wound and evacuated the hematoma.  The wound was packed.  Dr. Brantley Stage called me to explore the wound because he he had another patient who was already under general anesthesia in preparation for appendectomy.  When I arrived, the patient was already in the operating room with the neck prepped with Betadine and Dr. Brantley Stage standing by.  There was no active hemorrhage.  Operative Findings:       There was no major bleeder identified.  I found a couple of small bleeders on the trachea and the cricoid cartilage.  I found a couple of veins that were not bleeding but did not seem to be controlled so I placed clips on those.  I put very warm saline in the thyroid bed on both sides to see if I could stimulate any hemorrhage and there was none.  I packed both sides with febrile are hemostatic sponge and watched for over 10 minutes and found no bleeding.  I decided to place a 33 [place] Blake drains on each side.  There was no active  bleeding whatsoever at the completion of the case.  Procedure in Detail:          I arrived to find the patient intubated and under general anesthesia.  The neck was prepped with Betadine and the wound was packed open with 4X4 gauze and there was no active bleeding.  We draped the patient out.  Intravenous antibiotics were given.  Surgical timeout was performed.  I gently separated the skin edges and removed the packing.  There was no significant hemorrhage.  I placed a self-retaining retractor.  I removed the Vicryl sutures that were remaining in the strap muscles and retracted the strap muscles on both sides.  I irrigated out the paratracheal space.  I removed the pieces of hemostatic sponge.  I spent a great deal time irrigating out the wound.  I found no major bleeder.  I cauterized a couple of bleeders on the cricoid cartilage.  I placed metal clips on a  couple of veins that were not actively bleeding but were visible.  I put warm saline in and  observed for over 10 minutes and I could not stimulate any further bleeding.  I packed the wound with fibrillar sponge.  A 19 [place] Blake drain was placed on each side, in the paratracheal space and brought out through the strap muscles and then through the skin.  Each drain was sutured the skin with nylon sutures and connected to suction bulbs.     The strap muscles were closed loosely with a few 3-0 Vicryl sutures.  The platysma muscle was closed with a few interrupted 3-0 Vicryl sutures.  The skin was closed loosely with 4 staples.  Loose gauze bandage placed.  There was no bleeding from the drains noted.  Patient tolerated the procedure well.  Remained hemodynamically stable.  Was taken to the intensive care unit.     Edsel Petrin. Dalbert Batman, M.D., FACS General and Minimally Invasive Surgery Breast and Colorectal Surgery  01/05/2016 9:05 PM

## 2016-01-05 NOTE — Anesthesia Procedure Notes (Signed)
Procedure Name: Intubation Date/Time: 01/05/2016 2:28 PM Performed by: Lissa Morales Pre-anesthesia Checklist: Patient identified, Emergency Drugs available, Suction available and Patient being monitored Patient Re-evaluated:Patient Re-evaluated prior to inductionOxygen Delivery Method: Circle system utilized Preoxygenation: Pre-oxygenation with 100% oxygen Intubation Type: IV induction Ventilation: Mask ventilation without difficulty Laryngoscope Size: Mac and 4 Grade View: Grade III Tube type: Oral Tube size: 7.5 mm Number of attempts: 2 Airway Equipment and Method: Stylet and Oral airway Placement Confirmation: ETT inserted through vocal cords under direct vision,  positive ETCO2 and breath sounds checked- equal and bilateral Secured at: 22 cm Tube secured with: Tape Dental Injury: Teeth and Oropharynx as per pre-operative assessment  Difficulty Due To: Difficulty was unanticipated and Difficult Airway- due to anterior larynx Comments: CRNA unable to visualioze cords  ,slight deviation to left, Dr. Marcie Bal intubated with [facility] , only posterior arytenoids

## 2016-01-05 NOTE — Anesthesia Preprocedure Evaluation (Signed)
Anesthesia Evaluation  Patient identified by MRN, date of birth, ID band Patient unresponsive  Preop documentation limited or incomplete due to emergent nature of procedure.  Airway Mallampati: Intubated       Dental  (+) Teeth Intact   Pulmonary       + intubated    Cardiovascular  Rhythm:Regular Rate:Tachycardia     Neuro/Psych    GI/Hepatic   Endo/Other    Renal/GU      Musculoskeletal   Abdominal   Peds  Hematology   Anesthesia Other Findings Pt is s/p thyroidectomy earlier today who became acutely stridorous and short of breath on the floor, required CPR and emergent intubation for airway control and surgical neck decompression.Marland Kitchen He then came stat to OR for hematoma control..   VSS, regular rhythm, oxygenating well.. We will keep intubated overnight  Reproductive/Obstetrics                             Anesthesia Physical Anesthesia Plan  ASA: III and emergent  Anesthesia Plan: General   Post-op Pain Management:    Induction: Intravenous  Airway Management Planned: Oral ETT  Additional Equipment:   Intra-op Plan:   Post-operative Plan: Post-operative intubation/ventilation  Informed Consent:   Only emergency history available  Plan Discussed with: Anesthesiologist, Surgeon and CRNA  Anesthesia Plan Comments:         Anesthesia Quick Evaluation

## 2016-01-05 NOTE — Transfer of Care (Signed)
Immediate Anesthesia Transfer of Care Note  Patient: Stephen Benson  Procedure(s) Performed: Procedure(s): TOTAL THYROIDECTOMY (N/A)  Patient Location: PACU  Anesthesia Type:General  Level of Consciousness: awake, alert  and oriented  Airway & Oxygen Therapy: Patient Spontanous Breathing  Post-op Assessment: Report given to RN, Post -op Vital signs reviewed and stable and Patient moving all extremities X 4  Post vital signs: Reviewed and stable  Last Vitals:  Vitals:   01/05/16 1240 01/05/16 1702  BP: (!) 160/84 (!) 147/92  Pulse: (!) 113 (!) 105  Resp: 20 14  Temp: 37.1 C 36.6 C    Last Pain:  Vitals:   01/05/16 1240  TempSrc: Oral      Patients Stated Pain Goal: 6 ([id number])  Complications: No apparent anesthesia complications

## 2016-01-05 NOTE — Transfer of Care (Signed)
Immediate Anesthesia Transfer of Care Note  Patient: Stephen Benson  Procedure(s) Performed: Procedure(s) with comments: Neck Wound Exploration; control of Bleeders (N/A) - Neck Wound Exploration; control of Bleeders  Patient Location: NICU  Anesthesia Type:General  Level of Consciousness: sedated and Patient remains intubated per anesthesia plan  Airway & Oxygen Therapy: Patient remains intubated per anesthesia plan and Patient placed on Ventilator (see vital sign flow sheet for setting)  Post-op Assessment: Report given to RN and Post -op Vital signs reviewed and stable  Post vital signs: Reviewed and stable  Last Vitals:  Vitals:   01/05/16 1800 01/05/16 1830  BP: (!) 141/76 (!) 156/85  Pulse:  98  Resp:  18  Temp: 37.2 C 36.7 C    Last Pain:  Vitals:   01/05/16 1830  TempSrc: Oral  PainSc:       Patients Stated Pain Goal: 3 ([id number])  Complications: No apparent anesthesia complications

## 2016-01-05 NOTE — Progress Notes (Signed)
Dr Ermalene Postin notified of BP 150s/80s.  HR SR 90s.  Patient resting comfortably.  No new orders received.

## 2016-01-05 NOTE — Significant Event (Signed)
Rapid Response Event Note  Overview: Time Called: 1923 Arrival Time: 1925 Event Type: Respiratory, Cardiac  Initial Focused Assessment: Respiratory distress then CPR   Interventions: ACLS and BLS protocol  Plan of Care (if not transferred):  Event Summary: Called by unit RN due patient report of difficulty breathing. While in route to unit, a code blue was initiated by staff. Cpr was being performed. Patient had respiratory arrested and was unresponsive. Dr. Donella Stade was called to bedside. Airway appeared obstructed due to hematoma. Dr. Donella Stade removed the sutures at thyroidectomy site to relieve the hematoma pressure. Patient was intubated by anesthesia and airway was obtained. Patient regain respiratory effort and was taken to the OR emergently for further treatment.      Stephen Benson [place]

## 2016-01-05 NOTE — Progress Notes (Signed)
At around 1925 as we are doing our bedside reporting pt complained that he has difficulty of breathing, neck swollen, pt is diaphoretic. 1st shift RN called Md. O2 was started and rapid response RN was called. Pt oxygen level continue to drop down and pt become unresponsive. Code Bluie was activated code team was called. CPR was  Initiated and pt was revived. Pt brought back to OR for further intervention.

## 2016-01-05 NOTE — Anesthesia Preprocedure Evaluation (Signed)
Anesthesia Evaluation  Patient identified by MRN, date of birth, ID band Patient awake    Reviewed: Allergy & Precautions, H&P , NPO status , Patient's Chart, lab work & pertinent test results  Airway Mallampati: II   Neck ROM: full    Dental   Pulmonary shortness of breath,    breath sounds clear to auscultation       Cardiovascular negative cardio ROS   Rhythm:regular Rate:Normal     Neuro/Psych    GI/Hepatic   Endo/Other  Hyperthyroidism   Renal/GU      Musculoskeletal   Abdominal   Peds  Hematology   Anesthesia Other Findings   Reproductive/Obstetrics                             Anesthesia Physical Anesthesia Plan  ASA: II  Anesthesia Plan: General   Post-op Pain Management:    Induction: Intravenous  Airway Management Planned: Oral ETT  Additional Equipment:   Intra-op Plan:   Post-operative Plan: Extubation in OR  Informed Consent: I have reviewed the patients History and Physical, chart, labs and discussed the procedure including the risks, benefits and alternatives for the proposed anesthesia with the patient or authorized representative who has indicated his/her understanding and acceptance.     Plan Discussed with: CRNA, Surgeon and Anesthesiologist  Anesthesia Plan Comments:         Anesthesia Quick Evaluation

## 2016-01-05 NOTE — Op Note (Signed)
01/05/2016  4:48 PM  PATIENT:  Stephen Benson  25 y.o. male  PRE-OPERATIVE DIAGNOSIS:  GRAVES DISEASE  POST-OPERATIVE DIAGNOSIS:  GRAVES DISEASE  PROCEDURE:  Procedure(s): TOTAL THYROIDECTOMY (N/A)  SURGEON:  Surgeon(s) and Role:    * Ralene Ok, MD - Primary    * Fanny Skates, MD - Assisting  ANESTHESIA:   local and general  EBL:  Total I/O In: 1000 [I.V.:1000] Out: 200 [Blood:200]  BLOOD ADMINISTERED:none  DRAINS: none   LOCAL MEDICATIONS USED:  BUPIVICAINE   SPECIMEN:  Source of Specimen:  Total thyroid with stitch in right superior lobe  DISPOSITION OF SPECIMEN:  PATHOLOGY  COUNTS:  YES  TOURNIQUET:  * No tourniquets in log *  DICTATION: .Dragon Dictation  Findings: Enlarged bilateral thyroid lobes  Indications for procedure: The patient is a 25 year old male who had a history of Graves' disease. Patient was offered radioactive iodine by both endocrinology as well as myself however the patient preferred surgical total thyroidectomy.  The patient was thus scheduled for total thyroidectomy.   Details of the procedure:  The patient was taken back to the operating room. The patient was placed in supine position with bilateral SCDs in place.  The patient was prepped and draped in the usual sterile fashion. After appropriate anitbiotics were confirmed, a time-out was confirmed and all facts were verified.   A 4 cm incision was made approximately 2 fingerbreadths above the sternal notch. Bovie cautery was used to maintain hemostasis dissection was carried down through the platysma. The platysma was elevated and flaps were created superiorly and inferiorly to the thyroid cartilage as well as the sternal notch, repsectively. The strap muscles were identified in the midline and separated. Left-sided strap muscles were elevated off the anterior surface of the thyroid. This dissection was carried laterally. The left lobe was enlarged, and firm. There are enlarged blood  vessels along the capsule of the thyroid. The middle thyroid vein was identified and doubly ligated. We proceeded to dissect away the superior lobe and Kitners were used to gently dissect the surrounding musculature from the thyroid. The superior thyroid vessels were identified and doubly ligated with clips and transected with a Harmonic scalpel. After careful dissection this freed up the superior lobe was able to deliver this into the wound. We also identified the superior parathyroid gland which we preserved.   We continued to dissect the thyroid off of the trachea from lateral to medial direction. The left recurrent laryngeal nerve was identified, and protected the duration of the case.  The inferior thyroid vessels were identified ligated with clips. At this time Berry's ligament was dissected away from the trachea. This delivered the left lobe of the thyroid into the wound.  At this time we turned our attention to the right thyroid lobe. Again the right lobe was dissected laterally. The middle thyroid artery and vein were circumferentially dissected and doubly ligated with a clip and Harmonic scalpel. We proceeded to dissect the superior lobe. Again the right lobe was firm and enlarged. The right superior lobe was dissected away from the superior vessels. The superior vessels were ligated with a clip and Harmonic scalpel. This allowed [place] to bring the right superior thyroid lobe into the wound. We retracted this medially. The right recurrent laryngeal nerve was identified. There is a very firm adherent area of the thyroid to the area adjacent to the nerve. It was then decided to transect the thyroid lobe and leaving a portion of that attached to the trachea,  to help preserve the nerve. At this time we turned our attention to the inferior pole. Inferior pole was superficially dissected away. The inferior vessels were doubly ligated with a clip and Harmonic scalpel. We proceeded to reflect the right lobe  medially. This wasn't cauterized off the trachea.  This delivered into the wound. A stitch was placed in the right superior lobe.  The area was irrigated out. The dissection bed was hemostatic. We placed fibrillar hemostatic agent into the wound. Strap muscles were then reapproximated in the midline with interrupted 3-0 Vicryl stitches. The platysma was reapproximated using 3-0 Vicryl stitches in interrupted fashion. Skin was then reapproximated using a running subcuticular 4-0 Monocryl. The skin was then dressed with Dermabond. The patient was taken to the recovery room in stable condition.  Dr. Dalbert Batman was vital in helping with retraction of the firm thyroid lobe and with helping with ligating the appropriate vessels.   PLAN OF CARE: Admit for overnight observation  PATIENT DISPOSITION:  PACU - hemodynamically stable.   Delay start of Pharmacological VTE agent (>24hrs) due to surgical blood loss or risk of bleeding: not applicable

## 2016-01-05 NOTE — Anesthesia Postprocedure Evaluation (Signed)
Anesthesia Post Note  Patient: Stephen Benson  Procedure(s) Performed: Procedure(s) (LRB): Neck Wound Exploration; control of Bleeders (N/A)  Patient location during evaluation: SICU Anesthesia Type: General Level of consciousness: sedated Pain management: pain level controlled Vital Signs Assessment: post-procedure vital signs reviewed and stable Respiratory status: patient remains intubated per anesthesia plan Cardiovascular status: stable Anesthetic complications: no    Last Vitals:  Vitals:   01/05/16 1800 01/05/16 1830  BP: (!) 141/76 (!) 156/85  Pulse:  98  Resp:  18  Temp: 37.2 C 36.7 C    Last Pain:  Vitals:   01/05/16 1830  TempSrc: Oral  PainSc:                  Zenaida Deed

## 2016-01-05 NOTE — H&P (Signed)
History of Present Illness  The patient is a 25 year old male who presents with hyperthyroidism. Patient is a 25 year old male who is referred by Dr. Renato Shin for evaluation of Graves' disease. Patient was recently diagnosed with hypothyroidism. He was given methimazole. He states that his main symptom was weight loss. This stabilized with the methimazole. He does state that he has hypertension. He also states he does have some tremors. He is currently on propranolol for hypertension. Patient was offered radioactive iodine however declined and wished to proceed with surgery.   Past Surgical History No pertinent past surgical history  Diagnostic Studies History Colonoscopy never  Allergies  No Known Drug Allergies08/22/2017  Medication History  Propranolol HCl ER (60MG  Capsule ER 24HR, Oral) Active. Medications Reconciled  Social History  Alcohol use Occasional alcohol use. Caffeine use Tea. No drug use Tobacco use Never smoker.  Family History  Prostate Cancer Family Members In General. Thyroid problems Family Members In General.    Review of Systems General Present- Fatigue and Night Sweats. Not Present- Appetite Loss, Chills, Fever, Weight Gain and Weight Loss. Skin Not Present- Change in Wart/Mole, Dryness, Hives, Jaundice, New Lesions, Non-Healing Wounds, Rash and Ulcer. HEENT Present- Nose Bleed and Wears glasses/contact lenses. Not Present- Earache, Hearing Loss, Hoarseness, Oral Ulcers, Ringing in the Ears, Seasonal Allergies, Sinus Pain, Sore Throat, Visual Disturbances and Yellow Eyes. Respiratory Present- Snoring. Not Present- Bloody sputum, Chronic Cough, Difficulty Breathing and Wheezing. Breast Not Present- Breast Mass, Breast Pain, Nipple Discharge and Skin Changes. Cardiovascular Present- Rapid Heart Rate and Shortness of Breath. Not Present- Chest Pain, Difficulty Breathing Lying Down, Leg Cramps, Palpitations and Swelling of  Extremities. Gastrointestinal Not Present- Abdominal Pain, Bloating, Bloody Stool, Change in Bowel Habits, Chronic diarrhea, Constipation, Difficulty Swallowing, Excessive gas, Gets full quickly at meals, Hemorrhoids, Indigestion, Nausea, Rectal Pain and Vomiting. Male Genitourinary Not Present- Blood in Urine, Change in Urinary Stream, Frequency, Impotence, Nocturia, Painful Urination, Urgency and Urine Leakage. Musculoskeletal Not Present- Back Pain, Joint Pain, Joint Stiffness, Muscle Pain, Muscle Weakness and Swelling of Extremities. Neurological Present- Headaches and Tingling. Not Present- Decreased Memory, Fainting, Numbness, Seizures, Tremor, Trouble walking and Weakness. Psychiatric Present- Anxiety. Not Present- Bipolar, Change in Sleep Pattern, Depression, Fearful and Frequent crying. Endocrine Not Present- Cold Intolerance, Excessive Hunger, Hair Changes, Heat Intolerance, Hot flashes and New Diabetes. Hematology Not Present- Blood Thinners, Easy Bruising, Excessive bleeding, Gland problems, HIV and Persistent Infections.  BP (!) 160/84   Pulse (!) 113   Temp 98.7 F (37.1 C) (Oral)   Resp 20   Ht 5\' 7"  (1.702 m)   Wt 76.7 kg (169 lb 2 oz)   SpO2 98%   BMI 26.49 kg/m    Physical Exam  General Mental Status-Alert. General Appearance-Consistent with stated age. Hydration-Well hydrated. Voice-Normal.  Head and Neck Thyroid Gland Characteristics - enlarged, non-tender, soft.  Chest and Lung Exam Chest and lung exam reveals -quiet, even and easy respiratory effort with no use of accessory muscles and on auscultation, normal breath sounds, no adventitious sounds and normal vocal resonance. Inspection Chest Wall - Normal. Back - normal.  Cardiovascular Cardiovascular examination reveals -normal heart sounds, regular rate and rhythm with no murmurs and normal pedal pulses bilaterally.  Abdomen Inspection Inspection of the abdomen reveals - No Hernias.  Skin - Scar - no surgical scars. Palpation/Percussion Palpation and Percussion of the abdomen reveal - Soft, Non Tender, No Rebound tenderness, No Rigidity (guarding) and No hepatosplenomegaly. Auscultation Auscultation of the abdomen reveals -  Bowel sounds normal.    Assessment & Plan  GRAVES' DISEASE (E05.00) Impression: 25 year old male with hypothyroidism secondary to Graves' disease. 1. The patient like to proceed the operative for total thyroidectomy. I did discuss with them that the patient will require Synthroid lifetime. 2. I discussed with him the risks and benefits of the procedure to include but not limited to: Infection, bleeding, damage to structures, possible need for further surgery. The patient was understanding and wished to proceed.

## 2016-01-06 DIAGNOSIS — I469 Cardiac arrest, cause unspecified: Secondary | ICD-10-CM

## 2016-01-06 DIAGNOSIS — Z978 Presence of other specified devices: Secondary | ICD-10-CM

## 2016-01-06 DIAGNOSIS — Z01811 Encounter for preprocedural respiratory examination: Secondary | ICD-10-CM

## 2016-01-06 DIAGNOSIS — Z789 Other specified health status: Secondary | ICD-10-CM

## 2016-01-06 DIAGNOSIS — R092 Respiratory arrest: Secondary | ICD-10-CM

## 2016-01-06 LAB — BASIC METABOLIC PANEL
Anion gap: 8 (ref 5–15)
BUN: 6 mg/dL (ref 6–20)
CALCIUM: 8.8 mg/dL — AB (ref 8.9–10.3)
CO2: 23 mmol/L (ref 22–32)
CREATININE: 0.54 mg/dL — AB (ref 0.61–1.24)
Chloride: 106 mmol/L (ref 101–111)
GFR calc Af Amer: >60 mL/min (ref >60)
GFR calc non Af Amer: >60 mL/min (ref >60)
Glucose, Bld: 124 mg/dL — ABNORMAL HIGH (ref 65–99)
Potassium: 4 mmol/L (ref 3.5–5.1)
Sodium: 137 mmol/L (ref 135–145)

## 2016-01-06 LAB — GLUCOSE, CAPILLARY
GLUCOSE-CAPILLARY: 96 mg/dL (ref 65–99)
GLUCOSE-CAPILLARY: 94 mg/dL (ref 65–99)
Glucose-Capillary: 96 mg/dL (ref 65–99)
Glucose-Capillary: 96 mg/dL (ref 65–99)
Glucose-Capillary: 89 mg/dL (ref 65–99)

## 2016-01-06 LAB — CBC
HEMATOCRIT: 36.5 % — AB (ref 39.0–52.0)
HEMOGLOBIN: 12.5 g/dL — AB (ref 13.0–17.0)
MCH: 27 pg (ref 26.0–34.0)
MCHC: 34.2 g/dL (ref 30.0–36.0)
MCV: 78.8 fL (ref 78.0–100.0)
Platelets: 279 10*3/uL (ref 150–400)
RBC: 4.63 MIL/uL (ref 4.22–5.81)
RDW: 14 % (ref 11.5–15.5)
WBC: 15.6 10*3/uL — ABNORMAL HIGH (ref 4.0–10.5)

## 2016-01-06 LAB — MAGNESIUM: MAGNESIUM: 1.5 mg/dL — AB (ref 1.7–2.4)

## 2016-01-06 MED ORDER — INSULIN ASPART 100 UNIT/ML ~~LOC~~ SOLN: 0.0000 [IU] | SUBCUTANEOUS | Status: DC

## 2016-01-06 MED ORDER — LEVOTHYROXINE SODIUM 100 MCG IV SOLR
25.0000 ug | Freq: Every day | INTRAVENOUS | Status: DC
  Administered 2016-01-06 – 2016-01-08 (×3): 25 ug via INTRAVENOUS
  Filled 2016-01-06 (×3): qty 5

## 2016-01-06 MED ORDER — MAGNESIUM SULFATE 4 GM/100ML IV SOLN
4.0000 g | Freq: Once | INTRAVENOUS | Status: AC
Start: 2016-01-06 — End: 2016-01-06
  Administered 2016-01-06: 4 g via INTRAVENOUS
  Filled 2016-01-06: qty 100

## 2016-01-06 MED ORDER — PROPRANOLOL HCL 10 MG PO TABS
20.0000 mg | ORAL_TABLET | Freq: Two times a day (BID) | ORAL | Status: DC
  Administered 2016-01-06 – 2016-01-08 (×5): 20 mg via ORAL
  Filled 2016-01-06: qty 1
  Filled 2016-01-06: qty 2
  Filled 2016-01-06 (×2): qty 1
  Filled 2016-01-06: qty 2
  Filled 2016-01-06: qty 1

## 2016-01-06 NOTE — Progress Notes (Signed)
eLink Physician-Brief Progress Note Patient Name: Stephen Benson DOB: 1990/03/30 MRN: [id number]   Date of Service  01/06/2016  HPI/Events of Note  Elevated HR and bp s/p thyroidectomy for graves  eICU Interventions  inderal 20 mg bid ordered     Intervention Category Intermediate Interventions: Hypertension - evaluation and management  Mauri Brooklyn, P 01/06/2016, 4:19 PM

## 2016-01-06 NOTE — Progress Notes (Signed)
1 Day Post-Op  Subjective: Patient extubated, awake, alert Following commands Oriented   Objective: Vital signs in last 24 hours: Temp:  [97.7 F (36.5 C)-99 F (37.2 C)] 99 F (37.2 C) (10/28 0800) Pulse Rate:  [72-113] 79 (10/28 1000) Resp:  [13-24] 14 (10/28 1000) BP: (104-160)/(48-99) 110/60 (10/28 1000) SpO2:  [97 %-100 %] 100 % (10/28 1022) FiO2 (%):  [40 %-60 %] 40 % (10/28 0923) Weight:  [76.7 kg (169 lb 2 oz)-77.5 kg (170 lb 12.8 oz)] 77.5 kg (170 lb 12.8 oz) (10/27 1830) Last BM Date: 01/05/16  Intake/Output from previous day: 10/27 0701 - 10/28 0700 In: 3261.7 [I.V.:2711.7; IV Piggyback:150] Out: [id number] [Urine:700; Drains:85; Blood:230] Intake/Output this shift: Total I/O In: 142.5 [I.V.:142.5] Out: 20 [Drains:20]  General appearance: alert, cooperative and no distress Neck: Incision clean and dry; no swelling; minimal ecchymosis Cardio: regular rate and rhythm, S1, S2 normal, no murmur, click, rub or gallop Lungs - CTA B R drain with no output Left drain with some bloody output Lab Results:   Recent Labs  01/05/16 2319 01/06/16 0241  WBC 23.7* 15.6*  HGB 13.2 12.5*  HCT 37.6* 36.5*  PLT 328 279   BMET  Recent Labs  01/05/16 2319 01/06/16 0241  NA 139 137  K 4.2 4.0  CL 107 106  CO2 23 23  GLUCOSE 137* 124*  BUN 7 6  CREATININE 0.58* 0.54*  CALCIUM 8.8* 8.8*   PT/INR  Recent Labs  01/05/16 2319  LABPROT 15.1  INR 1.19   ABG  Recent Labs  01/05/16 2210  PHART 7.331*  HCO3 23.7    Studies/Results: Dg Chest 2 View  Result Date: 01/05/2016 CLINICAL DATA:  Preop thyroidectomy. EXAM: CHEST  2 VIEW COMPARISON:  None. FINDINGS: The heart size and mediastinal contours are within normal limits. Both lungs are clear. The visualized skeletal structures are unremarkable. IMPRESSION: No active cardiopulmonary disease. Electronically Signed   By: Marin Olp M.D.   On: 01/05/2016 13:09   Dg Chest Port 1 View  Result Date:  01/05/2016 CLINICAL DATA:  Initial evaluation for endotracheal tube placement. EXAM: PORTABLE CHEST 1 VIEW COMPARISON:  Prior radiograph from earlier the same day. FINDINGS: Endotracheal tube in place with tip positioned approximately 11 mm above the carina. Enteric tube courses in the the abdomen. Cardiac and mediastinal silhouettes grossly stable. Lungs hypoinflated with secondary diffuse bronchovascular crowding and congestion. No pulmonary edema or pleural effusion. No focal infiltrates. No pneumothorax. Osseous structures unchanged. IMPRESSION: 1. Tip of the endotracheal tube 11 mm above the carina. 2. Shallow lung inflation with mild diffuse bronchovascular crowding. Electronically Signed   By: Jeannine Boga M.D.   On: 01/05/2016 22:22    Anti-infectives: Anti-infectives    Start     Dose/Rate Route Frequency Ordered Stop   01/06/16 0600  ceFAZolin (ANCEF) IVPB 2g/100 mL premix     2 g 200 mL/hr over 30 Minutes Intravenous On call to O.R. 01/05/16 1243 01/05/16 1417   01/06/16 0200  ceFAZolin (ANCEF) IVPB 2g/100 mL premix  Status:  Discontinued     2 g 200 mL/hr over 30 Minutes Intravenous Every 8 hours 01/05/16 2156 01/05/16 2157   01/06/16 0200  ceFAZolin (ANCEF) IVPB 2g/100 mL premix     2 g 200 mL/hr over 30 Minutes Intravenous Every 8 hours 01/05/16 2156        Assessment/Plan: S/p Total thyroidectomy [id number] Complicated by post-operative hematoma/ respiratory arrest -  S/p evacuation of hematoma Now extubated Neuro intact No  signs of active bleeding  Transfer to step-down Clear liquids  LOS: 1 day    Rishab Stoudt K. 01/06/2016

## 2016-01-06 NOTE — Consult Note (Addendum)
PULMONARY  / CRITICAL CARE MEDICINE CONSULTATION   Name: Zerik Harbaugh MRN: [id number] DOB: June 17, 1990    ADMISSION DATE:  01/05/2016 CONSULTATION DATE: 01/05/16  REQUESTING CLINICIAN: Ralene Ok, MD PRIMARY SERVICE: Surgery  CHIEF COMPLAINT:  Hyperthyroidism  BRIEF PATIENT DESCRIPTION: 25 y/o man w/ hyperthyroidism POD:0 of thyroidectomy who developed respiratory and subsequent cardiac arrest from hematoma compressing his trachea.  SIGNIFICANT EVENTS / STUDIES:  >> Thyroidectomy 10/27 >> Respiratory distress followed by arrest and intubation w/ ROSC 10/27 >> Exploratory surgery w/ drain insertion and vessel ligation 10/27  LINES / TUBES: JP drains x2 Airway 8 mm ETT PIV x2 Foley  CULTURES: None  ANTIBIOTICS: Ancef (x2 doses)  SUBJECTIVE: follow commands, no pressors   VITAL SIGNS: Temp:  [97.7 F (36.5 C)-99 F (37.2 C)] 99 F (37.2 C) (10/28 0800) Pulse Rate:  [72-113] 91 (10/28 0923) Resp:  [13-24] 19 (10/28 0923) BP: (104-160)/(48-99) 113/56 (10/28 0923) SpO2:  [97 %-100 %] 100 % (10/28 0923) FiO2 (%):  [40 %-60 %] 40 % (10/28 0923) Weight:  [76.7 kg (169 lb 2 oz)-77.5 kg (170 lb 12.8 oz)] 77.5 kg (170 lb 12.8 oz) (10/27 1830) HEMODYNAMICS:   VENTILATOR SETTINGS: Vent Mode: PSV;CPAP FiO2 (%):  [40 %-60 %] 40 % Set Rate:  [15 bmp] 15 bmp Vt Set:  [550 mL] 550 mL PEEP:  [5 cmH20] 5 cmH20 Pressure Support:  [5 cmH20-10 cmH20] 5 cmH20 Plateau Pressure:  [15 cmH20-16 cmH20] 15 cmH20 INTAKE / OUTPUT: Intake/Output      10/27 0701 - 10/28 0700 10/28 0701 - 10/29 0700   P.O. 0    I.V. (mL/kg) 2711.7 (35) 142.5 (1.8)   Other 400    IV Piggyback 150    Total Intake(mL/kg) 3261.7 (42.1) 142.5 (1.8)   Urine (mL/kg/hr) 700    Drains 85 20 (0.1)   Blood 230    Total Output 1015 20   Net +2246.7 +122.5          PHYSICAL EXAMINATION: General:  Thin man in no acute distress Neuro:  Sedated, but arousable follows comamnds, rass 0  HEENT:  ETT in place,  mild exopthalmos Neck: bandage and JP drains in place Cardiovascular:  s1 s2 rrr Lungs:  CTA Abdomen:  Soft, BS  Wnl, no r/g Musculoskeletal:  No deformities Skin:  No rashes  LABS:  CBC  Recent Labs Lab 01/05/16 1303 01/05/16 2319 01/06/16 0241  WBC 6.4 23.7* 15.6*  HGB 15.6 13.2 12.5*  HCT 43.4 37.6* 36.5*  PLT 339 328 279   Coag's  Recent Labs Lab 01/05/16 2319  APTT 27  INR 1.19   BMET  Recent Labs Lab 01/05/16 1303 01/05/16 2319 01/06/16 0241  NA 139 139 137  K 3.6 4.2 4.0  CL 106 107 106  CO2 23 23 23   BUN 6 7 6   CREATININE 0.49* 0.58* 0.54*  GLUCOSE 97 137* 124*   Electrolytes  Recent Labs Lab 01/05/16 1303 01/05/16 2319 01/06/16 0241  CALCIUM 9.7 8.8* 8.8*  MG  --   --  1.5*   Sepsis Markers No results for input(s): LATICACIDVEN, PROCALCITON, O2SATVEN in the last 168 hours. ABG  Recent Labs Lab 01/05/16 2210  PHART 7.331*  PCO2ART 46.1  PO2ART 294*   Liver Enzymes  Recent Labs Lab 01/05/16 1303  AST 26  ALT 36  ALKPHOS 150*  BILITOT 1.1  ALBUMIN 4.2   Cardiac Enzymes No results for input(s): TROPONINI, PROBNP in the last 168 hours. Glucose No results for input(s): GLUCAP  in the last 168 hours.  Imaging Dg Chest 2 View  Result Date: 01/05/2016 CLINICAL DATA:  Preop thyroidectomy. EXAM: CHEST  2 VIEW COMPARISON:  None. FINDINGS: The heart size and mediastinal contours are within normal limits. Both lungs are clear. The visualized skeletal structures are unremarkable. IMPRESSION: No active cardiopulmonary disease. Electronically Signed   By: Marin Olp M.D.   On: 01/05/2016 13:09   Dg Chest Port 1 View  Result Date: 01/05/2016 CLINICAL DATA:  Initial evaluation for endotracheal tube placement. EXAM: PORTABLE CHEST 1 VIEW COMPARISON:  Prior radiograph from earlier the same day. FINDINGS: Endotracheal tube in place with tip positioned approximately 11 mm above the carina. Enteric tube courses in the the abdomen. Cardiac  and mediastinal silhouettes grossly stable. Lungs hypoinflated with secondary diffuse bronchovascular crowding and congestion. No pulmonary edema or pleural effusion. No focal infiltrates. No pneumothorax. Osseous structures unchanged. IMPRESSION: 1. Tip of the endotracheal tube 11 mm above the carina. 2. Shallow lung inflation with mild diffuse bronchovascular crowding. Electronically Signed   By: Jeannine Boga M.D.   On: 01/05/2016 22:22    EKG: NSR CXR: No acute abnormalities  ASSESSMENT / PLAN:  PULMONARY A: Extrinsic compression of trachea Need for mechanical ventilation P:   Leak test needed Weaning cpap 5 ps5, goal 1 hr, assess rsbi pcxr in am  Keep same MV on vent when on rest, see abg  CARDIOVASCULAR A: S/p cardiac arrest P:   Tele Treat pain  RENAL A: At risk ATN post arrest P:   Chem in am  Mag supp Pos balance ok  GASTROINTESTINAL A: At risk dysphagia P:   Will need slp after extubation Ensure on gastric protection  HEMATOLOGIC A: Acute bleed in neck P:   S/p surgical repair scd  INFECTIOUS A: No active issues P:   Monitor temp  ENDOCRINE A: Grave's disease Thyroidectomy P:   Will monitor tonight, will need to start thyroid replacement low dose 25 IV until able to take oral Serial calcium measurements with ion now  NEUROLOGIC A: Appears intact, agitation P:   precedex with wua now  I updated family  Ccm time 28 min  Lavon Paganini. Titus Mould, MD, [place] Pgr: [facility]

## 2016-01-06 NOTE — Progress Notes (Signed)
Called by nurse earlier for neck swelling and stridor after thyroidectomy Went immediately  to floor and found CPR in progress  With patient  no pulse/ no BP ACLS started by staff.  Noted to have large hematoma in thyroid bed Wound opened bluntly and large hematoma evacuated  CPR continueD and signs of life returned Pt had some purposeful movement  He was intubated by anesthesia and taken emergently by Dr Dalbert Batman  To OR since another emergency case was going on simultaneously which  I was responsible.  Family updated at the unit desk.

## 2016-01-06 NOTE — Progress Notes (Signed)
RT switched Pt back to PRVC from CPAP on vent due to pt RR low. E-Link MD notified No new orders

## 2016-01-06 NOTE — Consult Note (Signed)
PULMONARY  / CRITICAL CARE MEDICINE CONSULTATION   Name: Stephen Benson MRN: [id number] DOB: October 10, 1990    ADMISSION DATE:  01/05/2016 CONSULTATION DATE: 01/05/16  REQUESTING CLINICIAN: Ralene Ok, MD PRIMARY SERVICE: Surgery  CHIEF COMPLAINT:  Hyperthyroidism  BRIEF PATIENT DESCRIPTION: 25 y/o man w/ hyperthyroidism POD:0 of thyroidectomy who developed respiratory and subsequent cardiac arrest from hematoma compressing his trachea.  SIGNIFICANT EVENTS / STUDIES:  >> Thyroidectomy 10/27 >> Respiratory distress followed by arrest and intubation w/ ROSC 10/27 >> Exploratory surgery w/ drain insertion and vessel ligation 10/27  LINES / TUBES: JP drains x2 Airway 8 mm ETT PIV x2 Foley  CULTURES: None  ANTIBIOTICS: Ancef (x2 doses)  HISTORY OF PRESENT ILLNESS:   25 y/o man w/ Grave's dz, treated w/ methimazole, propranolol, elected for thyroidectomy. Surgery was complicated by post-op bleeding, hematoma w/ tracheal compression, respiratory and cardiac arrest. He achieved ROSC w/ neuro status able to follow commands and answer yes/no questions. PCCM consulted for vent management following repeat surgery.  PAST MEDICAL HISTORY :  Past Medical History:  Diagnosis Date  . Dyspnea    with exertion  . Primary hyperthyroidism    Past Surgical History:  Procedure Laterality Date  . NO PAST SURGERIES     Prior to Admission medications   Medication Sig Start Date End Date Taking? Authorizing Provider  calcium-vitamin D (OSCAL WITH D) 500-200 MG-UNIT tablet Take 1 tablet by mouth 3 (three) times daily. 01/05/16   Ralene Ok, MD  levothyroxine (SYNTHROID) 112 MCG tablet Take 1 tablet (112 mcg total) by mouth daily before breakfast. 01/05/16   Ralene Ok, MD  Magnesium Oxide 500 MG CAPS Take 1 capsule (500 mg total) by mouth 2 (two) times daily. 01/05/16   Ralene Ok, MD  oxyCODONE-acetaminophen (ROXICET) 5-325 MG tablet Take 1-2 tablets by mouth every 4 (four)  hours as needed. 01/05/16   Ralene Ok, MD  propranolol (INDERAL) 60 MG tablet Take 0.5 tablets (30 mg total) by mouth 2 (two) times daily. Patient not taking: Reported on 01/05/2016 09/26/15   Renato Shin, MD   No Known Allergies  FAMILY HISTORY:  Family History  Problem Relation Age of Onset  . Hyperthyroidism Other    SOCIAL HISTORY:  reports that he has never smoked. He has never used smokeless tobacco. He reports that he drinks alcohol. He reports that he uses drugs, including Marijuana.  REVIEW OF SYSTEMS:  Unable to query due to intubation  SUBJECTIVE:   VITAL SIGNS: Temp:  [97.9 F (36.6 C)-99 F (37.2 C)] 98 F (36.7 C) (10/27 1830) Pulse Rate:  [79-113] 79 (10/28 0000) Resp:  [13-20] 13 (10/28 0000) BP: (108-160)/(59-99) 108/59 (10/28 0000) SpO2:  [97 %-100 %] 100 % (10/28 0000) FiO2 (%):  [40 %-60 %] 40 % (10/28 0008) Weight:  [169 lb 2 oz (76.7 kg)-170 lb 12.8 oz (77.5 kg)] 170 lb 12.8 oz (77.5 kg) (10/27 1830) HEMODYNAMICS:   VENTILATOR SETTINGS: Vent Mode: PRVC FiO2 (%):  [40 %-60 %] 40 % Set Rate:  [15 bmp] 15 bmp Vt Set:  [550 mL] 550 mL PEEP:  [5 cmH20] 5 cmH20 Pressure Support:  [10 cmH20] 10 cmH20 Plateau Pressure:  [16 cmH20] 16 cmH20 INTAKE / OUTPUT: Intake/Output      10/27 0701 - 10/28 0700   P.O. 0   I.V. (mL/kg) 1748.9 (22.6)   Other 400   IV Piggyback 50   Total Intake(mL/kg) 2198.9 (28.4)   Urine (mL/kg/hr) 200   Drains 40   Blood 230  Total Output 470   Net +1728.9         PHYSICAL EXAMINATION: General:  Thin man in no acute distress Neuro:  Sedated, but arousable and able to follow commands HEENT:  ETT in place, mild exopthalmos Neck: bandage and JP drains in place Cardiovascular:  Normal HR Lungs:  Mechanical breath sounds Abdomen:  Soft Musculoskeletal:  No deformities Skin:  No rashes  LABS:  CBC  Recent Labs Lab 01/05/16 1303 01/05/16 2319  WBC 6.4 23.7*  HGB 15.6 13.2  HCT 43.4 37.6*  PLT 339 328    Coag's  Recent Labs Lab 01/05/16 2319  APTT 27  INR 1.19   BMET  Recent Labs Lab 01/05/16 1303 01/05/16 2319  NA 139 139  K 3.6 4.2  CL 106 107  CO2 23 23  BUN 6 7  CREATININE 0.49* 0.58*  GLUCOSE 97 137*   Electrolytes  Recent Labs Lab 01/05/16 1303 01/05/16 2319  CALCIUM 9.7 8.8*   Sepsis Markers No results for input(s): LATICACIDVEN, PROCALCITON, O2SATVEN in the last 168 hours. ABG  Recent Labs Lab 01/05/16 2210  PHART 7.331*  PCO2ART 46.1  PO2ART 294*   Liver Enzymes  Recent Labs Lab 01/05/16 1303  AST 26  ALT 36  ALKPHOS 150*  BILITOT 1.1  ALBUMIN 4.2   Cardiac Enzymes No results for input(s): TROPONINI, PROBNP in the last 168 hours. Glucose No results for input(s): GLUCAP in the last 168 hours.  Imaging Dg Chest 2 View  Result Date: 01/05/2016 CLINICAL DATA:  Preop thyroidectomy. EXAM: CHEST  2 VIEW COMPARISON:  None. FINDINGS: The heart size and mediastinal contours are within normal limits. Both lungs are clear. The visualized skeletal structures are unremarkable. IMPRESSION: No active cardiopulmonary disease. Electronically Signed   By: Marin Olp M.D.   On: 01/05/2016 13:09   Dg Chest Port 1 View  Result Date: 01/05/2016 CLINICAL DATA:  Initial evaluation for endotracheal tube placement. EXAM: PORTABLE CHEST 1 VIEW COMPARISON:  Prior radiograph from earlier the same day. FINDINGS: Endotracheal tube in place with tip positioned approximately 11 mm above the carina. Enteric tube courses in the the abdomen. Cardiac and mediastinal silhouettes grossly stable. Lungs hypoinflated with secondary diffuse bronchovascular crowding and congestion. No pulmonary edema or pleural effusion. No focal infiltrates. No pneumothorax. Osseous structures unchanged. IMPRESSION: 1. Tip of the endotracheal tube 11 mm above the carina. 2. Shallow lung inflation with mild diffuse bronchovascular crowding. Electronically Signed   By: Jeannine Boga M.D.    On: 01/05/2016 22:22    EKG: NSR CXR: No acute abnormalities  ASSESSMENT / PLAN:  PULMONARY A: Extrinsic compression of trachea Need for mechanical ventilation P:   Maintain on PS tonight If OK w/ surgery, can extubate tomorrow if cuff leak present.  CARDIOVASCULAR A: S/p cardiac arrest P:   Neuro status appears intact. Given concern for bleed, risks of cooling outweigh benefits  RENAL A: No active issues P:    GASTROINTESTINAL A: No active issues P:    HEMATOLOGIC A: Acute bleed in neck P:   S/p surgical repair  INFECTIOUS A: No active issues P:    ENDOCRINE A: Grave's disease Thyroidectomy P:   Will monitor tonight, will need to start thyroid replacement Serial calcium measurements for ?PTH loss.  NEUROLOGIC A: Appears intact P:   Doing well w/ precedex.  TODAY'S SUMMARY: 25 y/o man with Grave's dz, thyroidectomy c/b hemorrhage, tracheal compression and arrest.  I have personally obtained a history, examined the patient, evaluated  laboratory and imaging results, formulated the assessment and plan and placed orders.  CRITICAL CARE: The patient is critically ill with multiple organ systems failure and requires high complexity decision making for assessment and support, frequent evaluation and titration of therapies, application of advanced monitoring technologies and extensive interpretation of multiple databases. Critical Care Time devoted to patient care services described in this note is 30 minutes.   Luz Brazen, MD Pulmonary and [place] Pager: [phone]   01/06/2016, 12:47 AM

## 2016-01-07 ENCOUNTER — Inpatient Hospital Stay (HOSPITAL_COMMUNITY): Payer: Self-pay

## 2016-01-07 LAB — GLUCOSE, CAPILLARY
GLUCOSE-CAPILLARY: 82 mg/dL (ref 65–99)
GLUCOSE-CAPILLARY: 85 mg/dL (ref 65–99)
GLUCOSE-CAPILLARY: 88 mg/dL (ref 65–99)
GLUCOSE-CAPILLARY: 90 mg/dL (ref 65–99)
Glucose-Capillary: 95 mg/dL (ref 65–99)
Glucose-Capillary: 97 mg/dL (ref 65–99)
Glucose-Capillary: 143 mg/dL — ABNORMAL HIGH (ref 65–99)

## 2016-01-07 LAB — CALCIUM, IONIZED
Calcium, Ionized, Serum: 5 mg/dL (ref 4.5–5.6)
Calcium, Ionized, Serum: 5 mg/dL (ref 4.5–5.6)

## 2016-01-07 LAB — COMPREHENSIVE METABOLIC PANEL
ALK PHOS: 99 U/L (ref 38–126)
ALT: 18 U/L (ref 17–63)
AST: 20 U/L (ref 15–41)
Albumin: 3.2 g/dL — ABNORMAL LOW (ref 3.5–5.0)
Anion gap: 10 (ref 5–15)
BUN: <5 mg/dL — ABNORMAL LOW (ref 6–20)
CALCIUM: 9.1 mg/dL (ref 8.9–10.3)
CO2: 26 mmol/L (ref 22–32)
CREATININE: 0.5 mg/dL — AB (ref 0.61–1.24)
Chloride: 104 mmol/L (ref 101–111)
Glucose, Bld: 86 mg/dL (ref 65–99)
Potassium: 3.5 mmol/L (ref 3.5–5.1)
SODIUM: 140 mmol/L (ref 135–145)
Total Bilirubin: 1.5 mg/dL — ABNORMAL HIGH (ref 0.3–1.2)
Total Protein: 5.9 g/dL — ABNORMAL LOW (ref 6.5–8.1)

## 2016-01-07 LAB — PHOSPHORUS: PHOSPHORUS: 2.6 mg/dL (ref 2.5–4.6)

## 2016-01-07 LAB — CBC WITH DIFFERENTIAL/PLATELET
BASOS PCT: 0 %
Basophils Absolute: 0 10*3/uL (ref 0.0–0.1)
EOS ABS: 0.1 10*3/uL (ref 0.0–0.7)
EOS PCT: 1 %
HCT: 34.1 % — ABNORMAL LOW (ref 39.0–52.0)
HEMOGLOBIN: 11.6 g/dL — AB (ref 13.0–17.0)
Lymphocytes Relative: 27 %
Lymphs Abs: 3.1 10*3/uL (ref 0.7–4.0)
MCH: 26.9 pg (ref 26.0–34.0)
MCHC: 34 g/dL (ref 30.0–36.0)
MCV: 78.9 fL (ref 78.0–100.0)
MONOS PCT: 12 %
Monocytes Absolute: 1.3 10*3/uL — ABNORMAL HIGH (ref 0.1–1.0)
NEUTROS PCT: 60 %
Neutro Abs: 6.9 10*3/uL (ref 1.7–7.7)
PLATELETS: 320 10*3/uL (ref 150–400)
RBC: 4.32 MIL/uL (ref 4.22–5.81)
RDW: 14.2 % (ref 11.5–15.5)
WBC: 11.4 10*3/uL — ABNORMAL HIGH (ref 4.0–10.5)

## 2016-01-07 LAB — MAGNESIUM: MAGNESIUM: 1.3 mg/dL — AB (ref 1.7–2.4)

## 2016-01-07 MED ORDER — FENTANYL CITRATE (PF) 100 MCG/2ML IJ SOLN
25.0000 ug | INTRAMUSCULAR | Status: DC | PRN
  Administered 2016-01-07: 75 ug via INTRAVENOUS
  Filled 2016-01-07: qty 2

## 2016-01-07 NOTE — Progress Notes (Signed)
2 Days Post-Op  Subjective: LOOKS GOOD VOICE STRONG  MS CLEAR   Objective: Vital signs in last 24 hours: Temp:  [98.1 F (36.7 C)-99 F (37.2 C)] 98.5 F (36.9 C) (10/29 1514) Pulse Rate:  [89-104] 89 (10/29 1514) Resp:  [15-27] 15 (10/29 1514) BP: (138-155)/(77-86) 141/83 (10/29 1514) SpO2:  [98 %-100 %] 100 % (10/29 1514) Last BM Date: 01/07/16  Intake/Output from previous day: 10/28 0701 - 10/29 0700 In: 1731.2 [I.V.:1431.2; IV Piggyback:300] Out: 76 [Urine:670; Drains:135] Intake/Output this shift: Total I/O In: 1723.3 [P.O.:720; I.V.:653.3; IV Piggyback:350] Out: 760 [Urine:700; Drains:60]  Neck: INCISION INTACT  SOFT MINIMAL BLOODY DRAINAGE IN DRAINS   Lab Results:   Recent Labs  01/06/16 0241 01/07/16 0204  WBC 15.6* 11.4*  HGB 12.5* 11.6*  HCT 36.5* 34.1*  PLT 279 320   BMET  Recent Labs  01/06/16 0241 01/07/16 0204  NA 137 140  K 4.0 3.5  CL 106 104  CO2 23 26  GLUCOSE 124* 86  BUN 6 <5*  CREATININE 0.54* 0.50*  CALCIUM 8.8* 9.1   PT/INR  Recent Labs  01/05/16 2319  LABPROT 15.1  INR 1.19   ABG  Recent Labs  01/05/16 2210  PHART 7.331*  HCO3 23.7    Studies/Results: Dg Chest Port 1 View  Result Date: 01/07/2016 CLINICAL DATA:  Acute respiratory failure EXAM: PORTABLE CHEST 1 VIEW COMPARISON:  01/05/2016 FINDINGS: Interval extubation and removal of enteric tube. Lungs are clear.  No pleural effusion or pneumothorax. The heart is normal in size. Surgical drains in the bilateral neck. Associated surgical clips and overlying skin staples. IMPRESSION: Postsurgical changes in the bilateral neck. No evidence of acute cardiopulmonary disease. Electronically Signed   By: Julian Hy M.D.   On: 01/07/2016 09:36   Dg Chest Port 1 View  Result Date: 01/05/2016 CLINICAL DATA:  Initial evaluation for endotracheal tube placement. EXAM: PORTABLE CHEST 1 VIEW COMPARISON:  Prior radiograph from earlier the same day. FINDINGS: Endotracheal  tube in place with tip positioned approximately 11 mm above the carina. Enteric tube courses in the the abdomen. Cardiac and mediastinal silhouettes grossly stable. Lungs hypoinflated with secondary diffuse bronchovascular crowding and congestion. No pulmonary edema or pleural effusion. No focal infiltrates. No pneumothorax. Osseous structures unchanged. IMPRESSION: 1. Tip of the endotracheal tube 11 mm above the carina. 2. Shallow lung inflation with mild diffuse bronchovascular crowding. Electronically Signed   By: Jeannine Boga M.D.   On: 01/05/2016 22:22    Anti-infectives: Anti-infectives    Start     Dose/Rate Route Frequency Ordered Stop   01/06/16 0600  ceFAZolin (ANCEF) IVPB 2g/100 mL premix     2 g 200 mL/hr over 30 Minutes Intravenous On call to O.R. 01/05/16 1243 01/05/16 1417   01/06/16 0200  ceFAZolin (ANCEF) IVPB 2g/100 mL premix  Status:  Discontinued     2 g 200 mL/hr over 30 Minutes Intravenous Every 8 hours 01/05/16 2156 01/05/16 2157   01/06/16 0200  ceFAZolin (ANCEF) IVPB 2g/100 mL premix     2 g 200 mL/hr over 30 Minutes Intravenous Every 8 hours 01/05/16 2156        Assessment/Plan: s/p Procedure(s) with comments: Neck Wound Exploration; control of Bleeders (N/A) - Neck Wound Exploration; control of Bleeders TO [facility] DIET  LOS: 2 days    Danessa Mensch A. 01/07/2016

## 2016-01-08 ENCOUNTER — Encounter (HOSPITAL_COMMUNITY): Payer: Self-pay | Admitting: General Surgery

## 2016-01-08 LAB — BLOOD GAS, ARTERIAL
Acid-base deficit: 1.4 mmol/L (ref 0.0–2.0)
Bicarbonate: 23.7 mmol/L (ref 20.0–28.0)
Drawn by: 252031
FIO2: 60
MECHVT: 550 mL
O2 Saturation: 99.7 %
PATIENT TEMPERATURE: 98.6
PCO2 ART: 46.1 mmHg (ref 32.0–48.0)
PEEP: 5 cmH2O
PO2 ART: 294 mmHg — AB (ref 83.0–108.0)
RATE: 15 resp/min
pH, Arterial: 7.331 — ABNORMAL LOW (ref 7.350–7.450)

## 2016-01-08 LAB — CALCIUM, IONIZED
Calcium, Ionized, Serum: 5.1 mg/dL (ref 4.5–5.6)
Calcium, Ionized, Serum: 4.8 mg/dL (ref 4.5–5.6)

## 2016-01-08 MED ORDER — MAGNESIUM SULFATE 2 GM/50ML IV SOLN
2.0000 g | Freq: Once | INTRAVENOUS | Status: AC
  Administered 2016-01-08: 2 g via INTRAVENOUS
  Filled 2016-01-08: qty 50

## 2016-01-08 NOTE — Care Management Note (Signed)
Case Management Note  Patient Details  Name: Stephen Benson MRN: [id number] Date of Birth: 1990-06-11  Subjective/Objective:                    Action/Plan:  Patient aware [place] only covers 14 days of pain medication and does not cover over the counter medications and $3 co pay per prescription.  Expected Discharge Date:                  Expected Discharge Plan:  Home/Self Care  In-House Referral:  Financial Counselor  Discharge planning Services  CM Consult, [place] [facility], Medication Assistance, [facility]  Post Acute Care Choice:    Choice offered to:  Patient  DME Arranged:    DME Agency:     HH Arranged:    [place] Agency:     Status of Service:  Completed, signed off  If discussed at [employer], dates discussed:    Additional Comments:  Marilu Favre, RN 01/08/2016, 11:32 AM

## 2016-01-08 NOTE — Discharge Summary (Signed)
Physician Discharge Summary  Patient ID: Stephen Benson MRN: [id number] DOB/AGE: 02-Jun-1990 25 y.o.  Admit date: 01/05/2016 Discharge date: 01/08/2016  Admission Diagnoses: [place] Disease  Discharge Diagnoses:  Active Problems:   S/P total thyroidectomy   Graves disease   Pre-op chest exam   Endotracheally intubated   Intubation of airway performed without difficulty   Discharged Condition: good  Hospital Course: Post operatively pt was sent to the floor for OBS.  Please see op note for full details.  POD 0 pt had a bleeding episode that occluded his airway and required CPR for asystole.  Pt was intubated emergently and had the wound opened at the bedside and clot evacuated.  Pt was taken to the OR emergently by Dr. Dalbert Batman to explore the surgical bed.  There were no active bleeders that he could identify.  Drains were placed and he was taken to the ICU in guarded condition.  POD 1 he was extubated.  POD 2 he was sent to the floor and started on PO diet.  JP drains had some bloody output #1 >#2.  POD3 he was tolerating normal diet and min pain.  His JP #2 had min output and was removed.  He required magnesium IV and was on PO calcium and synthroid IV.  Pt was deemed stable for DC and Dc'd home POD 3.  Stapled were removed on DC and steris placed.    All his questions and his mother's questions were answered.  Pt with f/u appt next week in clinic.  Consults: Pulm- while ICU  Significant Diagnostic Studies: none  Treatments: surgery: as above  Discharge Exam: Blood pressure (!) 150/80, pulse 79, temperature 98.9 F (37.2 C), temperature source Oral, resp. rate 19, height 5\' 7"  (1.702 m), weight 77.5 kg (170 lb 12.8 oz), SpO2 96 %. General appearance: alert and cooperative Neck: incision c/d/i, JP drain SS  Disposition: Final discharge disposition not confirmed  Discharge Instructions    Diet - low sodium heart healthy    Complete by:  As directed    Diet - low sodium heart healthy     Complete by:  As directed    Increase activity slowly    Complete by:  As directed    Increase activity slowly    Complete by:  As directed        Medication List    TAKE these medications   calcium-vitamin D 500-200 MG-UNIT tablet Commonly known as:  OSCAL WITH D Take 1 tablet by mouth 3 (three) times daily.   levothyroxine 112 MCG tablet Commonly known as:  SYNTHROID Take 1 tablet (112 mcg total) by mouth daily before breakfast.   Magnesium Oxide 500 MG Caps Take 1 capsule (500 mg total) by mouth 2 (two) times daily.   oxyCODONE-acetaminophen 5-325 MG tablet Commonly known as:  ROXICET Take 1-2 tablets by mouth every 4 (four) hours as needed.   propranolol 60 MG tablet Commonly known as:  INDERAL Take 0.5 tablets (30 mg total) by mouth 2 (two) times daily.      Follow-up Information    Reyes Ivan, MD. Schedule an appointment as soon as possible for a visit in 2 weeks.   Specialty:  General Surgery Why:  For wound re-check Contact information: [address] [place] [address] [phone]           Signed: Rosario Jacks., [facility] 01/08/2016, 9:37 AM

## 2016-01-08 NOTE — Anesthesia Postprocedure Evaluation (Signed)
Anesthesia Post Note  Patient: Stephen Benson  Procedure(s) Performed: Procedure(s) (LRB): TOTAL THYROIDECTOMY (N/A)  Patient location during evaluation: PACU Anesthesia Type: General Level of consciousness: awake and alert Pain management: pain level controlled Vital Signs Assessment: post-procedure vital signs reviewed and stable Respiratory status: spontaneous breathing, nonlabored ventilation, respiratory function stable and patient connected to nasal cannula oxygen Cardiovascular status: blood pressure returned to baseline and stable Postop Assessment: no signs of nausea or vomiting Anesthetic complications: no    Last Vitals:  Vitals:   01/08/16 0533 01/08/16 0939  BP: (!) 150/80 (!) 144/86  Pulse: 79 84  Resp: 19   Temp: 37.2 C     Last Pain:  Vitals:   01/08/16 0533  TempSrc: Oral  PainSc:                  Keith Felten,JAMES TERRILL

## 2016-01-08 NOTE — Progress Notes (Signed)
Stephen Benson to be D/C'd  per MD order. Discussed with the patient and all questions fully answered.  VSS, Skin clean, dry and intact without evidence of skin break down, no evidence of skin tears noted.  IV catheter discontinued intact. Site without signs and symptoms of complications. Dressing and pressure applied.  An After Visit Summary was printed and given to the patient. Patient received prescription.  D/c education completed with patient/family including follow up instructions, medication list, d/c activities limitations if indicated, with other d/c instructions as indicated by MD - patient able to verbalize understanding, all questions fully answered. Taught and gave information sheet on JP drain.   Patient instructed to return to ED, call 911, or call MD for any changes in condition.   Patient to be escorted via [place], and D/C home via private auto.

## 2016-01-09 LAB — CALCIUM, IONIZED
CALCIUM, IONIZED, SERUM: 4.9 mg/dL (ref 4.5–5.6)
CALCIUM, IONIZED, SERUM: 5 mg/dL (ref 4.5–5.6)

## 2016-01-10 MED FILL — Medication: Qty: 1 | Status: AC

## 2016-04-05 ENCOUNTER — Other Ambulatory Visit: Payer: Self-pay | Admitting: Endocrinology

## 2016-04-07 NOTE — Telephone Encounter (Signed)
Please refill x 1 Ov is due  

## 2016-04-08 ENCOUNTER — Ambulatory Visit (INDEPENDENT_AMBULATORY_CARE_PROVIDER_SITE_OTHER): Payer: Self-pay | Admitting: Endocrinology

## 2016-04-08 ENCOUNTER — Encounter: Payer: Self-pay | Admitting: Endocrinology

## 2016-04-08 DIAGNOSIS — E890 Postprocedural hypothyroidism: Secondary | ICD-10-CM

## 2016-04-08 LAB — VITAMIN D 25 HYDROXY (VIT D DEFICIENCY, FRACTURES): VITD: 10.65 ng/mL — AB (ref 30.00–100.00)

## 2016-04-08 LAB — TSH: TSH: 0.27 u[IU]/mL — AB (ref 0.35–4.50)

## 2016-04-08 MED ORDER — LEVOTHYROXINE SODIUM 100 MCG PO TABS: 100.0000 ug | ORAL_TABLET | Freq: Every day | ORAL | 11 refills | Status: DC

## 2016-04-08 MED ORDER — VITAMIN D (ERGOCALCIFEROL) 1.25 MG (50000 UNIT) PO CAPS: 50000.0000 [IU] | ORAL_CAPSULE | ORAL | 0 refills | Status: AC

## 2016-04-08 NOTE — Patient Instructions (Addendum)
blood tests are requested for you today.  We'll let you know about the results. I would be happy to see you back here as needed.   You should see a PCP, because your blood pressure is high.

## 2016-04-08 NOTE — Progress Notes (Signed)
   Subjective:    Patient ID: Stephen Benson, male    DOB: 12-10-1990, 26 y.o.   MRN: [id number]  HPI  The state of at least three ongoing medical problems is addressed today, with interval history of each noted here: Pt returns for f/u of hyperthyroidism (due to [place]; dx'ed 2017; he declines RAI, due to cost; nuc med scan in 2017 showed high uptake and diffuse uptake; he was rx'ed tapazole and inderal).  He underwent total thyroidectomy in mid-2017.  He takes synthroid as rx'ed.   Hypocalcemia: he no longer takes Ca++ supplement.  He has few mos of intermittent moderate cramps in all 4 limbs, but no assoc numbness. HTN: he stopped inderal.  Past Medical History:  Diagnosis Date  . Dyspnea    with exertion  . Primary hyperthyroidism     Past Surgical History:  Procedure Laterality Date  . MASS BIOPSY N/A 01/05/2016   Procedure: Neck Wound Exploration; control of Bleeders;  Surgeon: Fanny Skates, MD;  Location: [place];  Service: General;  Laterality: N/A;  Neck Wound Exploration; control of Bleeders  . NO PAST SURGERIES    . THYROIDECTOMY N/A 01/05/2016   Procedure: TOTAL THYROIDECTOMY;  Surgeon: Ralene Ok, MD;  Location: [place];  Service: General;  Laterality: N/A;    Social History   Social History  . Marital status: Single    Spouse name: N/A  . Number of children: N/A  . Years of education: N/A   Occupational History  . Not on file.   Social History Main Topics  . Smoking status: Never Smoker  . Smokeless tobacco: Never Used  . Alcohol use Yes     Comment: rare  . Drug use: Yes    Types: Marijuana     Comment: last time - 01/03/16  . Sexual activity: Not on file   Other Topics Concern  . Not on file   Social History Narrative  . No narrative on file    No current outpatient prescriptions on file prior to visit.   No current facility-administered medications on file prior to visit.     No Known Allergies  Family History  Problem Relation Age of  Onset  . Hyperthyroidism Other     BP (!) 146/94   Pulse 72   Ht 5' 8.5" (1.74 m)   Wt 208 lb (94.3 kg)   SpO2 97%   BMI 31.17 kg/m   Review of Systems He has gained weight.  Denies diplopia.     Objective:   Physical Exam VITAL SIGNS:  See vs page GENERAL: no distress Eyes: moderate bilat proptosis Neck: a healed scar is present.  i do not appreciate a nodule in the thyroid or elsewhere in the neck. Ext: no edema.   Lab Results  Component Value Date   TSH 0.27 (L) 04/08/2016  25-OH vit-D=11    Assessment & Plan:  HTN: persistent Post-surgical hypothyroidism.  He needs increased rx.  I have sent a prescription to your pharmacy.  Recheck TSH 30d. Vit-D deficiency, new.  I have sent a prescription to your pharmacy.  Recheck 30d  Patient is advised the following: Patient Instructions  blood tests are requested for you today.  We'll let you know about the results. I would be happy to see you back here as needed.   You should see a PCP, because your blood pressure is high.

## 2016-04-09 LAB — PTH, INTACT AND CALCIUM
Calcium: 9.6 mg/dL (ref 8.6–10.3)
PTH: 106 pg/mL — ABNORMAL HIGH (ref 14–64)

## 2016-05-09 ENCOUNTER — Other Ambulatory Visit (INDEPENDENT_AMBULATORY_CARE_PROVIDER_SITE_OTHER): Payer: Self-pay

## 2016-05-09 DIAGNOSIS — E890 Postprocedural hypothyroidism: Secondary | ICD-10-CM

## 2016-05-09 LAB — VITAMIN D 25 HYDROXY (VIT D DEFICIENCY, FRACTURES): VITD: 38.43 ng/mL (ref 30.00–100.00)

## 2016-05-09 LAB — TSH: TSH: 1.97 u[IU]/mL (ref 0.35–4.50)

## 2016-05-10 LAB — PTH, INTACT AND CALCIUM
CALCIUM: 9.6 mg/dL (ref 8.6–10.3)
PTH: 79 pg/mL — ABNORMAL HIGH (ref 14–64)

## 2017-02-19 ENCOUNTER — Other Ambulatory Visit: Payer: Self-pay | Admitting: Endocrinology

## 2017-02-19 NOTE — Telephone Encounter (Signed)
Please refill x 1 Ov is due  

## 2017-03-25 ENCOUNTER — Ambulatory Visit (INDEPENDENT_AMBULATORY_CARE_PROVIDER_SITE_OTHER): Payer: Self-pay | Admitting: Endocrinology

## 2017-03-25 ENCOUNTER — Encounter: Payer: Self-pay | Admitting: Endocrinology

## 2017-03-25 VITALS — BP 150/98 | HR 86 | Wt 214.0 lb

## 2017-03-25 DIAGNOSIS — E213 Hyperparathyroidism, unspecified: Secondary | ICD-10-CM

## 2017-03-25 DIAGNOSIS — E890 Postprocedural hypothyroidism: Secondary | ICD-10-CM

## 2017-03-25 NOTE — Patient Instructions (Addendum)
Please see your PCP, for the blood pressure. blood tests are requested for you today.  We'll let you know about the results. I would be happy to see you back here as needed.

## 2017-03-25 NOTE — Progress Notes (Signed)
   Subjective:    Patient ID: Stephen Benson, male    DOB: 08/16/1990, 27 y.o.   MRN: [id number]  HPI The state of at least three ongoing medical problems is addressed today, with interval history of each noted here: Pt returns for f/u of postsurgical hypothyroidism (due to [place]; dx'ed 2017; he declines RAI, due to cost; nuc med scan in 2017 showed high uptake and diffuse uptake; he was rx'ed tapazole and inderal; he underwent total thyroidectomy in mid-2017.  He takes synthroid as rx'ed.   Hyperparathyroidism: he denies cramps.  He takes vit-D, 1000 units/day Past Medical History:  Diagnosis Date  . Dyspnea    with exertion  . Primary hyperthyroidism     Past Surgical History:  Procedure Laterality Date  . MASS BIOPSY N/A 01/05/2016   Procedure: Neck Wound Exploration; control of Bleeders;  Surgeon: Fanny Skates, MD;  Location: [place];  Service: General;  Laterality: N/A;  Neck Wound Exploration; control of Bleeders  . NO PAST SURGERIES    . THYROIDECTOMY N/A 01/05/2016   Procedure: TOTAL THYROIDECTOMY;  Surgeon: Ralene Ok, MD;  Location: [place];  Service: General;  Laterality: N/A;    Social History   Socioeconomic History  . Marital status: Single    Spouse name: Not on file  . Number of children: Not on file  . Years of education: Not on file  . Highest education level: Not on file  Social Needs  . Financial resource strain: Not on file  . Food insecurity - worry: Not on file  . Food insecurity - inability: Not on file  . Transportation needs - medical: Not on file  . Transportation needs - non-medical: Not on file  Occupational History  . Not on file  Tobacco Use  . Smoking status: Never Smoker  . Smokeless tobacco: Never Used  Substance and Sexual Activity  . Alcohol use: Yes    Comment: rare  . Drug use: Yes    Types: Marijuana    Comment: last time - 01/03/16  . Sexual activity: Not on file  Other Topics Concern  . Not on file  Social History  Narrative  . Not on file    Current Outpatient Medications on File Prior to Visit  Medication Sig Dispense Refill  . levothyroxine (SYNTHROID, LEVOTHROID) 100 MCG tablet TAKE ONE TABLET BY MOUTH EVERY DAY BEFORE breakfast 30 tablet 1   No current facility-administered medications on file prior to visit.     No Known Allergies  Family History  Problem Relation Age of Onset  . Hyperthyroidism Other     BP (!) 150/98 (BP Location: Left Arm, Patient Position: Sitting, Cuff Size: Normal)   Pulse 86   Wt 214 lb (97.1 kg)   SpO2 98%   BMI 32.07 kg/m    Review of Systems Denies numbness.     Objective:   Physical Exam VITAL SIGNS:  See vs page GENERAL: no distress Neck: a healed scar is present.  I do not appreciate a nodule in the thyroid or elsewhere in the neck      Assessment & Plan:  Hypothyroidism: due for recheck.   Hyperparathyroidism: mild.  Not clear if primary or secondary.   HTN: he needs f/u  Patient Instructions  Please see your PCP, for the blood pressure. blood tests are requested for you today.  We'll let you know about the results. I would be happy to see you back here as needed.

## 2017-04-14 ENCOUNTER — Other Ambulatory Visit: Payer: Self-pay | Admitting: Endocrinology

## 2017-06-23 ENCOUNTER — Other Ambulatory Visit: Payer: Self-pay | Admitting: Endocrinology

## 2017-08-12 ENCOUNTER — Other Ambulatory Visit: Payer: Self-pay | Admitting: Endocrinology

## 2017-09-08 IMAGING — CR DG CHEST 1V PORT
1 series · 1 of 1 positions shown · non-contrast
Comparison: 01/05/2016

CLINICAL DATA: Acute respiratory failure

EXAM:
PORTABLE CHEST 1 VIEW

[AP]
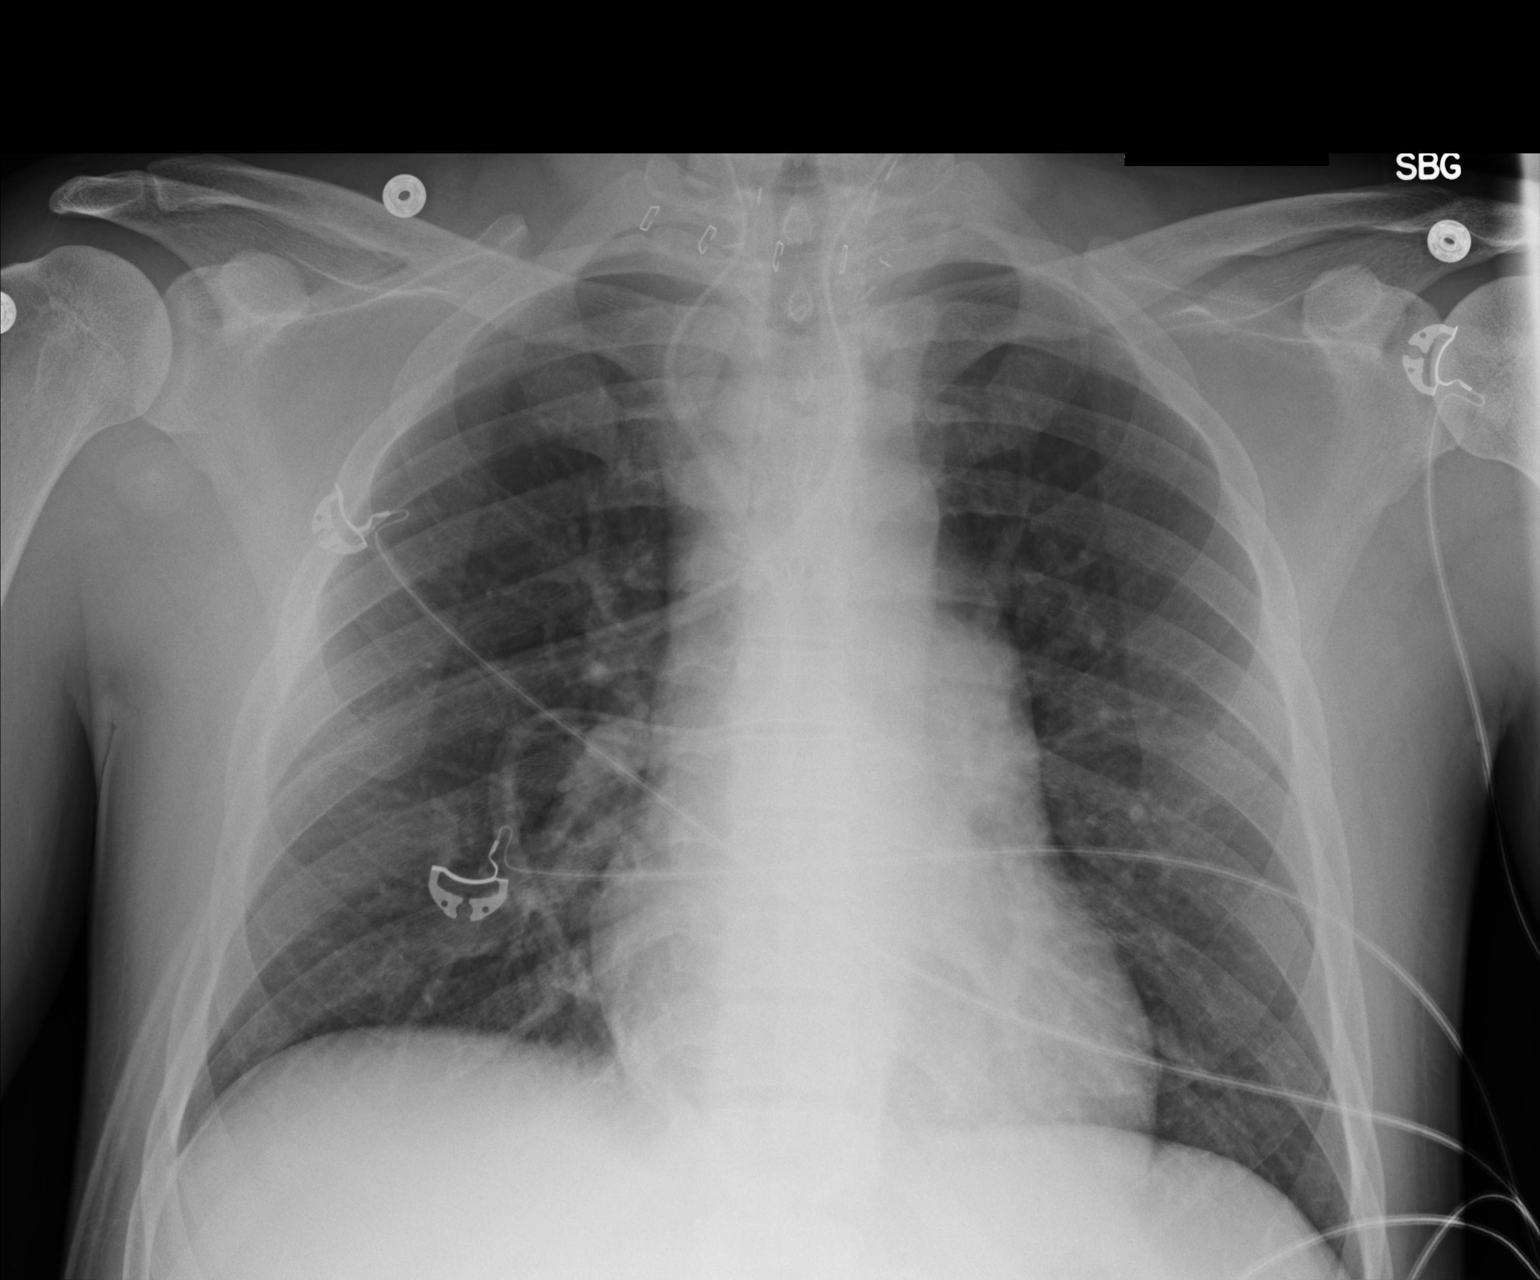

[1 of 1 positions shown; findings below may reference images not displayed]

FINDINGS: Interval extubation and removal of enteric tube.

Lungs are clear.  No pleural effusion or pneumothorax.

The heart is normal in size.

Surgical drains in the bilateral neck. Associated surgical clips and
overlying skin staples.
IMPRESSION: Postsurgical changes in the bilateral neck.

No evidence of acute cardiopulmonary disease.

## 2017-10-27 ENCOUNTER — Other Ambulatory Visit: Payer: Self-pay | Admitting: Endocrinology

## 2017-12-06 ENCOUNTER — Other Ambulatory Visit: Payer: Self-pay | Admitting: Endocrinology

## 2018-01-05 ENCOUNTER — Other Ambulatory Visit: Payer: Self-pay | Admitting: Endocrinology

## 2018-03-02 ENCOUNTER — Other Ambulatory Visit: Payer: Self-pay | Admitting: Endocrinology

## 2018-03-30 ENCOUNTER — Other Ambulatory Visit: Payer: Self-pay | Admitting: Endocrinology

## 2018-05-27 ENCOUNTER — Other Ambulatory Visit: Payer: Self-pay | Admitting: Endocrinology

## 2018-07-27 ENCOUNTER — Other Ambulatory Visit: Payer: Self-pay

## 2018-07-27 DIAGNOSIS — E890 Postprocedural hypothyroidism: Secondary | ICD-10-CM

## 2018-07-27 MED ORDER — LEVOTHYROXINE SODIUM 100 MCG PO TABS: ORAL_TABLET | ORAL | 0 refills | Status: DC

## 2018-10-05 ENCOUNTER — Other Ambulatory Visit: Payer: Self-pay

## 2018-10-05 ENCOUNTER — Ambulatory Visit: Payer: Self-pay | Admitting: Endocrinology

## 2018-10-05 ENCOUNTER — Encounter: Payer: Self-pay | Admitting: Endocrinology

## 2018-10-05 VITALS — BP 184/102 | HR 90 | Temp 98.4°F | Wt 203.0 lb

## 2018-10-05 DIAGNOSIS — E890 Postprocedural hypothyroidism: Secondary | ICD-10-CM

## 2018-10-05 DIAGNOSIS — E213 Hyperparathyroidism, unspecified: Secondary | ICD-10-CM

## 2018-10-05 LAB — VITAMIN D 25 HYDROXY (VIT D DEFICIENCY, FRACTURES): VITD: 26.31 ng/mL — ABNORMAL LOW (ref 30.00–100.00)

## 2018-10-05 LAB — TSH: TSH: 17.98 u[IU]/mL — ABNORMAL HIGH (ref 0.35–4.50)

## 2018-10-05 MED ORDER — LEVOTHYROXINE SODIUM 112 MCG PO TABS: 112.0000 ug | ORAL_TABLET | Freq: Every day | ORAL | 11 refills | Status: DC

## 2018-10-05 NOTE — Progress Notes (Signed)
Subjective:    Patient ID: Stephen Benson, male    DOB: [birth year], 28 y.o.   MRN: [id number]  HPI Pt returns for f/u of postsurgical hypothyroidism (due to [place]; dx'ed 2017; he declines RAI, due to cost; nuc med scan in 2017 showed high uptake and diffuse uptake; he was rx'ed tapazole and inderal; he underwent total thyroidectomy in mid-2017.  He takes synthroid as rx'ed.   Hyperparathyroidism: he denies cramps.  He takes vit-D, 2000 units/day.   Past Medical History:  Diagnosis Date  . Dyspnea    with exertion  . Primary hyperthyroidism     Past Surgical History:  Procedure Laterality Date  . MASS BIOPSY N/A 01/05/2016   Procedure: Neck Wound Exploration; control of Bleeders;  Surgeon: Fanny Skates, MD;  Location: [place];  Service: General;  Laterality: N/A;  Neck Wound Exploration; control of Bleeders  . NO PAST SURGERIES    . THYROIDECTOMY N/A 01/05/2016   Procedure: TOTAL THYROIDECTOMY;  Surgeon: Ralene Ok, MD;  Location: [place];  Service: General;  Laterality: N/A;    Social History   Socioeconomic History  . Marital status: Single    Spouse name: Not on file  . Number of children: Not on file  . Years of education: Not on file  . Highest education level: Not on file  Occupational History  . Not on file  Social Needs  . Financial resource strain: Not on file  . Food insecurity    Worry: Not on file    Inability: Not on file  . Transportation needs    Medical: Not on file    Non-medical: Not on file  Tobacco Use  . Smoking status: Never Smoker  . Smokeless tobacco: Never Used  Substance and Sexual Activity  . Alcohol use: Yes    Comment: rare  . Drug use: Yes    Types: Marijuana    Comment: last time - 01/03/16  . Sexual activity: Not on file  Lifestyle  . Physical activity    Days per week: Not on file    Minutes per session: Not on file  . Stress: Not on file  Relationships  . Social Herbalist on phone: Not on file    Gets  together: Not on file    Attends religious service: Not on file    Active member of club or organization: Not on file    Attends meetings of clubs or organizations: Not on file    Relationship status: Not on file  . Intimate partner violence    Fear of current or ex partner: Not on file    Emotionally abused: Not on file    Physically abused: Not on file    Forced sexual activity: Not on file  Other Topics Concern  . Not on file  Social History Narrative  . Not on file    No current outpatient medications on file prior to visit.   No current facility-administered medications on file prior to visit.     No Known Allergies  Family History  Problem Relation Age of Onset  . Hyperthyroidism Other     BP (!) 184/102 (BP Location: Left Arm, Patient Position: Sitting, Cuff Size: Normal)   Pulse 90   Temp 98.4 F (36.9 C) (Oral)   Wt 203 lb (92.1 kg)   SpO2 98%   BMI 30.42 kg/m    Review of Systems Denies numbness.     Objective:   Physical Exam VITAL  SIGNS:  See vs page GENERAL: no distress Neck: a healed scar is present.  I do not appreciate a nodule in the thyroid or elsewhere in the neck.    Lab Results  Component Value Date   PTH 32 10/05/2018   CALCIUM 9.8 10/05/2018   PHOS 2.6 01/07/2016       Assessment & Plan:  HTN: is noted today Hyperparathyroidism: well-controlled.  Hypothyroidism: recheck today.    Patient Instructions  Your blood pressure is high today.  Please see your primary care provider soon, to have it rechecked blood tests are requested for you today.  We'll let you know about the results.   Please come back for a follow-up appointment in 1 year.

## 2018-10-05 NOTE — Patient Instructions (Addendum)
Your blood pressure is high today.  Please see your primary care provider soon, to have it rechecked blood tests are requested for you today.  We'll let you know about the results.   Please come back for a follow-up appointment in 1 year.

## 2018-10-06 LAB — PTH, INTACT AND CALCIUM
Calcium: 9.8 mg/dL (ref 8.6–10.3)
PTH: 32 pg/mL (ref 14–64)

## 2018-10-21 ENCOUNTER — Other Ambulatory Visit: Payer: Self-pay | Admitting: Endocrinology

## 2018-10-21 DIAGNOSIS — E890 Postprocedural hypothyroidism: Secondary | ICD-10-CM

## 2018-11-06 ENCOUNTER — Other Ambulatory Visit: Payer: Self-pay

## 2018-11-06 ENCOUNTER — Other Ambulatory Visit: Payer: Self-pay | Admitting: Endocrinology

## 2018-11-06 ENCOUNTER — Other Ambulatory Visit (INDEPENDENT_AMBULATORY_CARE_PROVIDER_SITE_OTHER): Payer: Self-pay

## 2018-11-06 DIAGNOSIS — E890 Postprocedural hypothyroidism: Secondary | ICD-10-CM

## 2018-11-06 LAB — TSH: TSH: 6.23 u[IU]/mL — ABNORMAL HIGH (ref 0.35–4.50)

## 2018-11-06 MED ORDER — LEVOTHYROXINE SODIUM 125 MCG PO TABS: 125.0000 ug | ORAL_TABLET | Freq: Every day | ORAL | 3 refills | Status: DC

## 2019-09-27 ENCOUNTER — Other Ambulatory Visit: Payer: Self-pay | Admitting: Endocrinology

## 2019-10-05 ENCOUNTER — Encounter: Payer: Self-pay | Admitting: Endocrinology

## 2019-10-05 ENCOUNTER — Ambulatory Visit: Payer: Self-pay | Admitting: Endocrinology

## 2019-10-05 ENCOUNTER — Other Ambulatory Visit: Payer: Self-pay

## 2019-10-05 VITALS — BP 170/110 | HR 83 | Ht 68.5 in | Wt 199.2 lb

## 2019-10-05 DIAGNOSIS — I10 Essential (primary) hypertension: Secondary | ICD-10-CM

## 2019-10-05 DIAGNOSIS — E213 Hyperparathyroidism, unspecified: Secondary | ICD-10-CM

## 2019-10-05 DIAGNOSIS — E890 Postprocedural hypothyroidism: Secondary | ICD-10-CM

## 2019-10-05 LAB — VITAMIN D 25 HYDROXY (VIT D DEFICIENCY, FRACTURES): VITD: 26.46 ng/mL — ABNORMAL LOW (ref 30.00–100.00)

## 2019-10-05 LAB — TSH: TSH: 2.71 u[IU]/mL (ref 0.35–4.50)

## 2019-10-05 NOTE — Progress Notes (Signed)
Subjective:    Patient ID: Stephen Benson, male    DOB: 1990/03/18, 29 y.o.   MRN: [id number]  HPI Pt returns for f/u of postsurgical hypothyroidism (due to [place]; dx'ed 2017; he declines RAI; nuc med scan in 2017 showed high uptake and diffuse uptake; he was rx'ed tapazole; he had total thyroidectomy in 2017.  He takes synthroid as rx'ed.   Hyperparathyroidism: He takes vit-D, 5000 units/day.   Pt reports anxiety and palpitations.   Past Medical History:  Diagnosis Date  . Dyspnea    with exertion  . Primary hyperthyroidism     Past Surgical History:  Procedure Laterality Date  . MASS BIOPSY N/A 01/05/2016   Procedure: Neck Wound Exploration; control of Bleeders;  Surgeon: Fanny Skates, MD;  Location: [place];  Service: General;  Laterality: N/A;  Neck Wound Exploration; control of Bleeders  . NO PAST SURGERIES    . THYROIDECTOMY N/A 01/05/2016   Procedure: TOTAL THYROIDECTOMY;  Surgeon: Ralene Ok, MD;  Location: [place];  Service: General;  Laterality: N/A;    Social History   Socioeconomic History  . Marital status: Single    Spouse name: Not on file  . Number of children: Not on file  . Years of education: Not on file  . Highest education level: Not on file  Occupational History  . Not on file  Tobacco Use  . Smoking status: Never Smoker  . Smokeless tobacco: Never Used  Substance and Sexual Activity  . Alcohol use: Yes    Comment: rare  . Drug use: Yes    Types: Marijuana    Comment: last time - 01/03/16  . Sexual activity: Not on file  Other Topics Concern  . Not on file  Social History Narrative  . Not on file   Social Determinants of Health   Financial Resource Strain:   . Difficulty of Paying Living Expenses:   Food Insecurity:   . Worried About Charity fundraiser in the Last Year:   . Arboriculturist in the Last Year:   Transportation Needs:   . Film/video editor (Medical):   Marland Kitchen Lack of Transportation (Non-Medical):   Physical  Activity:   . Days of Exercise per Week:   . Minutes of Exercise per Session:   Stress:   . Feeling of Stress :   Social Connections:   . Frequency of Communication with Friends and Family:   . Frequency of Social Gatherings with Friends and Family:   . Attends Religious Services:   . Active Member of Clubs or Organizations:   . Attends Archivist Meetings:   Marland Kitchen Marital Status:   Intimate Partner Violence:   . Fear of Current or Ex-Partner:   . Emotionally Abused:   Marland Kitchen Physically Abused:   . Sexually Abused:     Current Outpatient Medications on File Prior to Visit  Medication Sig Dispense Refill  . levothyroxine (SYNTHROID) 125 MCG tablet TAKE ONE TABLET BY MOUTH EVERY DAY WITH BREAKFAST 30 tablet 0   No current facility-administered medications on file prior to visit.    No Known Allergies  Family History  Problem Relation Age of Onset  . Hyperthyroidism Other     BP (!) 170/110 (BP Location: Left Arm, Cuff Size: Normal)   Pulse 83   Ht 5' 8.5" (1.74 m)   Wt 199 lb 3.2 oz (90.4 kg)   SpO2 98%   BMI 29.85 kg/m    Review of Systems  Denies fever/headache/diaphoresis    Objective:   Physical Exam VITAL SIGNS:  See vs page. GENERAL: no distress.  Neck: a healed scar is present.  I do not appreciate a nodule in the thyroid or elsewhere in the neck.    Lab Results  Component Value Date   TSH 2.71 10/05/2019        Assessment & Plan:  HTN: worse Hypothyroidism: well-replaced.  Please continue the same medications Hyperparathyroidism: recheck today  Patient Instructions  Your blood pressure is high today.  Please see your primary care provider soon, to have it rechecked blood tests are requested for you today.  We'll let you know about the results.   It is best to never miss the medication.  However, if you do miss it, next best is to double up the next time.   Please come back for a follow-up appointment in 1 year.

## 2019-10-05 NOTE — Patient Instructions (Signed)
Your blood pressure is high today.  Please see your primary care provider soon, to have it rechecked.   °blood tests are requested for you today.  We'll let you know about the results.   °It is best to never miss the medication.  However, if you do miss it, next best is to double up the next time.   °Please come back for a follow-up appointment in 1 year.   °

## 2019-10-12 LAB — CATECHOLAMINES, FRACTIONATED, PLASMA
Dopamine: <10 pg/mL
Epinephrine: 22 pg/mL
Norepinephrine: 394 pg/mL
Total Catecholamines: 416 pg/mL

## 2019-10-12 LAB — PTH, INTACT AND CALCIUM
Calcium: 9.4 mg/dL (ref 8.6–10.3)
PTH: 36 pg/mL (ref 14–64)

## 2019-10-12 LAB — METANEPHRINES, PLASMA
Metanephrine, Free: 26 pg/mL (ref <=57)
Normetanephrine, Free: 31 pg/mL (ref <=148)
Total Metanephrines-Plasma: 57 pg/mL (ref <=205)

## 2019-10-14 ENCOUNTER — Telehealth: Payer: Self-pay

## 2019-10-14 DIAGNOSIS — E559 Vitamin D deficiency, unspecified: Secondary | ICD-10-CM

## 2019-10-14 MED ORDER — VITAMIN D 50 MCG (2000 UT) PO CAPS: 3.0000 | ORAL_CAPSULE | Freq: Every day | ORAL | 0 refills | Status: AC

## 2019-10-14 NOTE — Telephone Encounter (Signed)
RESULTS  Results were reviewed by Dr. Loanne Drilling. Called pt to inform about results as well as new orders. Using closed-loop communication, pt verbalized complete acceptance and understanding of all information provided. No further questions nor concerns were voiced at this time. Medication list also updated to reflect new OTC orders.

## 2019-10-14 NOTE — Telephone Encounter (Signed)
-----   Message from Renato Shin, MD sent at 10/13/2019  5:34 PM EDT ----- please contact patient: The vitamin-D is slightly low again.  Please increase to 6000 units/d.  The other results are good. I'll see you next time.

## 2019-11-30 ENCOUNTER — Other Ambulatory Visit: Payer: Self-pay | Admitting: Endocrinology

## 2020-02-28 ENCOUNTER — Other Ambulatory Visit: Payer: Self-pay | Admitting: Endocrinology

## 2020-05-22 ENCOUNTER — Other Ambulatory Visit: Payer: Self-pay | Admitting: Endocrinology

## 2020-09-18 ENCOUNTER — Other Ambulatory Visit: Payer: Self-pay | Admitting: Endocrinology

## 2020-10-03 ENCOUNTER — Ambulatory Visit (INDEPENDENT_AMBULATORY_CARE_PROVIDER_SITE_OTHER): Payer: Self-pay | Admitting: Endocrinology

## 2020-10-03 ENCOUNTER — Other Ambulatory Visit: Payer: Self-pay

## 2020-10-03 VITALS — BP 150/82 | HR 73 | Ht 68.5 in | Wt 183.6 lb

## 2020-10-03 DIAGNOSIS — E890 Postprocedural hypothyroidism: Secondary | ICD-10-CM

## 2020-10-03 DIAGNOSIS — E213 Hyperparathyroidism, unspecified: Secondary | ICD-10-CM

## 2020-10-03 LAB — VITAMIN D 25 HYDROXY (VIT D DEFICIENCY, FRACTURES): VITD: 50.66 ng/mL (ref 30.00–100.00)

## 2020-10-03 LAB — T4, FREE: Free T4: 1.11 ng/dL (ref 0.60–1.60)

## 2020-10-03 LAB — TSH: TSH: 7.11 u[IU]/mL — ABNORMAL HIGH (ref 0.35–5.50)

## 2020-10-03 NOTE — Progress Notes (Signed)
   Subjective:    Patient ID: Stephen Benson, male    DOB: May 17, 1990, 30 y.o.   MRN: [id number]  HPI Pt returns for f/u of postsurgical hypothyroidism (due to [place]; dx'ed 2017; he declines RAI; nuc med scan in 2017 showed high uptake and diffuse uptake; he was rx'ed tapazole; he had total thyroidectomy in 2017.  He takes synthroid as rx'ed.   Pt also has secondary hyperparathyroidism: He takes vit-D, 6000 units/day.  Past Medical History:  Diagnosis Date   Dyspnea    with exertion   Primary hyperthyroidism     Past Surgical History:  Procedure Laterality Date   MASS BIOPSY N/A 01/05/2016   Procedure: Neck Wound Exploration; control of Bleeders;  Surgeon: Fanny Skates, MD;  Location: [facility];  Service: General;  Laterality: N/A;  Neck Wound Exploration; control of Bleeders   NO PAST SURGERIES     THYROIDECTOMY N/A 01/05/2016   Procedure: TOTAL THYROIDECTOMY;  Surgeon: Ralene Ok, MD;  Location: [facility];  Service: General;  Laterality: N/A;    Social History   Socioeconomic History   Marital status: Single    Spouse name: Not on file   Number of children: Not on file   Years of education: Not on file   Highest education level: Not on file  Occupational History   Not on file  Tobacco Use   Smoking status: Never   Smokeless tobacco: Never  Substance and Sexual Activity   Alcohol use: Yes    Comment: rare   Drug use: Yes    Types: Marijuana    Comment: last time - 01/03/16   Sexual activity: Not on file  Other Topics Concern   Not on file  Social History Narrative   Not on file   Social Determinants of Health   Financial Resource Strain: Not on file  Food Insecurity: Not on file  Transportation Needs: Not on file  Physical Activity: Not on file  Stress: Not on file  Social Connections: Not on file  Intimate Partner Violence: Not on file    Current Outpatient Medications on File Prior to Visit  Medication Sig Dispense Refill   Cholecalciferol (VITAMIN D)  50 MCG (2000 UT) CAPS Take 3 capsules (6,000 Units total) by mouth daily. 30 capsule 0   lisinopril (ZESTRIL) 5 MG tablet lisinopril 5 mg tablet  Take 1 tablet every day by oral route.     No current facility-administered medications on file prior to visit.    No Known Allergies  Family History  Problem Relation Age of Onset   Hyperthyroidism Other     BP (!) 150/82 (BP Location: Right Arm, Patient Position: Sitting, Cuff Size: Normal)   Pulse 73   Ht 5' 8.5" (1.74 m)   Wt 183 lb 9.6 oz (83.3 kg)   SpO2 97%   BMI 27.51 kg/m    Review of Systems     Objective:   Physical Exam Neck: a healed scar is present.  I do not appreciate a nodule in the thyroid or elsewhere in the neck  Lab Results  Component Value Date   TSH 7.11 (H) 10/03/2020   Lab Results  Component Value Date   PTH 47 10/03/2020   CALCIUM 9.7 10/03/2020   PHOS 2.6 01/07/2016     25-OH Vit-D=51    Assessment & Plan:  Hypothyroidism: uncontrolled.  I have sent a prescription to increase synthroid Hyperparathyroidism: well-controlled.  Please continue the same Vit-D

## 2020-10-03 NOTE — Patient Instructions (Signed)
Your blood pressure is high today.  Please see your primary care provider soon, to have it rechecked.   blood tests are requested for you today.  We'll let you know about the results.   It is best to never miss the medication.  However, if you do miss it, next best is to double up the next time.   Please come back for a follow-up appointment in 1 year.

## 2020-10-04 LAB — PTH, INTACT AND CALCIUM
Calcium: 9.7 mg/dL (ref 8.6–10.3)
PTH: 47 pg/mL (ref 16–77)

## 2020-10-04 MED ORDER — LEVOTHYROXINE SODIUM 137 MCG PO TABS: 137.0000 ug | ORAL_TABLET | Freq: Every day | ORAL | 3 refills | Status: DC

## 2020-10-09 ENCOUNTER — Telehealth: Payer: Self-pay | Admitting: Endocrinology

## 2020-10-09 NOTE — Telephone Encounter (Signed)
Pt calling in asking if we have received his blood work back, he would like to know the results. Pt would like a call back

## 2020-10-11 ENCOUNTER — Other Ambulatory Visit: Payer: Self-pay

## 2020-10-11 ENCOUNTER — Telehealth: Payer: Self-pay | Admitting: Endocrinology

## 2020-10-11 MED ORDER — LEVOTHYROXINE SODIUM 137 MCG PO TABS: 137.0000 ug | ORAL_TABLET | Freq: Every day | ORAL | 3 refills | Status: DC

## 2020-10-11 NOTE — Telephone Encounter (Signed)
Pt voiced that his pharmacy has shut down and is wanting his medications sent to [facility] on [address], [place], [place] [address] from now on . Pt is needing the refill sent for the  levothyroxine (SYNTHROID) 137 MCG tablet

## 2021-05-04 ENCOUNTER — Other Ambulatory Visit: Payer: Self-pay

## 2021-05-04 ENCOUNTER — Ambulatory Visit (INDEPENDENT_AMBULATORY_CARE_PROVIDER_SITE_OTHER): Payer: Self-pay | Admitting: Endocrinology

## 2021-05-04 ENCOUNTER — Encounter: Payer: Self-pay | Admitting: Endocrinology

## 2021-05-04 VITALS — BP 158/84 | HR 108 | Ht 68.5 in | Wt 198.0 lb

## 2021-05-04 DIAGNOSIS — E890 Postprocedural hypothyroidism: Secondary | ICD-10-CM

## 2021-05-04 DIAGNOSIS — E213 Hyperparathyroidism, unspecified: Secondary | ICD-10-CM

## 2021-05-04 LAB — VITAMIN D 25 HYDROXY (VIT D DEFICIENCY, FRACTURES): VITD: 42.74 ng/mL (ref 30.00–100.00)

## 2021-05-04 LAB — T4, FREE: Free T4: 1.19 ng/dL (ref 0.60–1.60)

## 2021-05-04 LAB — TSH: TSH: 0.28 u[IU]/mL — ABNORMAL LOW (ref 0.35–5.50)

## 2021-05-04 NOTE — Progress Notes (Signed)
Subjective:    Patient ID: Stephen Benson, male    DOB: 1990/12/19, 31 y.o.   MRN: [id number]  HPI Pt returns for f/u of postsurgical hypothyroidism (due to [place]; dx'ed 2017; he declined RAI; nuc med scan in 2017 showed high and diffuse uptake; he was rx'ed tapazole; he then had total thyroidectomy in 2017.  He takes synthroid as rx'ed.   Pt also has secondary hyperparathyroidism: He takes vit-D, 6000 units/day.  Past Medical History:  Diagnosis Date   Dyspnea    with exertion   Primary hyperthyroidism     Past Surgical History:  Procedure Laterality Date   MASS BIOPSY N/A 01/05/2016   Procedure: Neck Wound Exploration; control of Bleeders;  Surgeon: Fanny Skates, MD;  Location: [facility];  Service: General;  Laterality: N/A;  Neck Wound Exploration; control of Bleeders   NO PAST SURGERIES     THYROIDECTOMY N/A 01/05/2016   Procedure: TOTAL THYROIDECTOMY;  Surgeon: Ralene Ok, MD;  Location: [facility];  Service: General;  Laterality: N/A;    Social History   Socioeconomic History   Marital status: Single    Spouse name: Not on file   Number of children: Not on file   Years of education: Not on file   Highest education level: Not on file  Occupational History   Not on file  Tobacco Use   Smoking status: Never   Smokeless tobacco: Never  Substance and Sexual Activity   Alcohol use: Yes    Comment: rare   Drug use: Yes    Types: Marijuana    Comment: last time - 01/03/16   Sexual activity: Not on file  Other Topics Concern   Not on file  Social History Narrative   Not on file   Social Determinants of Health   Financial Resource Strain: Not on file  Food Insecurity: Not on file  Transportation Needs: Not on file  Physical Activity: Not on file  Stress: Not on file  Social Connections: Not on file  Intimate Partner Violence: Not on file    Current Outpatient Medications on File Prior to Visit  Medication Sig Dispense Refill   Cholecalciferol (VITAMIN D) 50  MCG (2000 UT) CAPS Take 3 capsules (6,000 Units total) by mouth daily. 30 capsule 0   levothyroxine (SYNTHROID) 137 MCG tablet Take 1 tablet (137 mcg total) by mouth daily before breakfast. 90 tablet 3   lisinopril (ZESTRIL) 5 MG tablet lisinopril 5 mg tablet  Take 1 tablet every day by oral route.     No current facility-administered medications on file prior to visit.    No Known Allergies  Family History  Problem Relation Age of Onset   Hyperthyroidism Other     BP (!) 158/84 (BP Location: Left Arm, Patient Position: Sitting, Cuff Size: Normal)    Pulse (!) 108    Ht 5' 8.5" (1.74 m)    Wt 198 lb (89.8 kg)    SpO2 99%    BMI 29.67 kg/m     Review of Systems     Objective:   Physical Exam VITAL SIGNS:  See vs page GENERAL: no distress NECK: There is no palpable thyroid enlargement.  No thyroid nodule is palpable.  No palpable lymphadenopathy at the anterior neck.   Lab Results  Component Value Date   TSH 0.28 (L) 05/04/2021      Assessment & Plan:  Hypothyroidism: overcontrolled.  As TSH was the other way last time, I advised him to continue the  same synthroid Hyperparathyroidism: recheck today

## 2021-05-04 NOTE — Patient Instructions (Signed)
Your blood pressure is high today.  Please see your primary care provider soon, to have it rechecked.   blood tests are requested for you today.  We'll let you know about the results.   It is best to never miss the medication.  However, if you do miss it, next best is to double up the next time.   Please come back for a follow-up appointment in 1 year.

## 2021-05-07 LAB — PTH, INTACT AND CALCIUM
Calcium: 9.7 mg/dL (ref 8.6–10.3)
PTH: 50 pg/mL (ref 16–77)

## 2021-10-03 ENCOUNTER — Ambulatory Visit: Payer: Self-pay | Admitting: Endocrinology

## 2021-12-12 NOTE — Progress Notes (Signed)
Name: Stephen Benson  MRN/ DOB: [id number], 1991-02-23    Age/ Sex: 31 y.o., male     PCP: Ardyth Gal, MD (Inactive)   Reason for Endocrinology Evaluation: Postoperative hypothyroidism     Initial Endocrinology Clinic Visit: 06/27/2015    PATIENT IDENTIFIER: Stephen Benson is a 54 y.o., male with a past medical history of postoperative hypothyroidism. He has followed with [facility] since 4/18/20217 for consultative assistance with management of his postoperative hypothyroidism.   HISTORICAL SUMMARY: The patient was first diagnosed with hyperthyroidism secondary to Graves' Disease in 2017. Pt was on Methimazole temporarily until her thyroid surgery.  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  Pt has been on LT-4 replacement since his thyroid sx   He was followed by Dr. Loanne Drilling from 2017 until 04/2021  SUBJECTIVE:    Today (12/13/2021):  Mr. [name] is here for Hx of PTC and postoperative hypothyroidism   Weight has been fluctuating  Denies constipation or diarrhea Denies palpitations  Denies tremors  Denies local neck swelling   Pt tells me he recently had labs through his PCP and TSH was low again   Levothyroxine 137 mcg daily      HISTORY:  Past Medical History:  Past Medical History:  Diagnosis Date   Dyspnea    with exertion   Primary hyperthyroidism    Past Surgical History:  Past Surgical History:  Procedure Laterality Date   MASS BIOPSY N/A 01/05/2016   Procedure: Neck Wound Exploration; control of Bleeders;  Surgeon: Fanny Skates, MD;  Location: [facility];  Service: General;  Laterality: N/A;  Neck Wound Exploration; control of Bleeders   NO PAST SURGERIES      THYROIDECTOMY N/A 01/05/2016   Procedure: TOTAL THYROIDECTOMY;  Surgeon: Ralene Ok, MD;  Location: [facility];  Service: General;  Laterality: N/A;   Social History:  reports that he has never smoked. He has never used smokeless tobacco. He reports current alcohol use. He reports current drug use. Drug: Marijuana. Family History:  Family History  Problem Relation Age of Onset   Hyperthyroidism Other      HOME MEDICATIONS: Allergies as of 12/13/2021   No Known Allergies      Medication List        Accurate as of December 13, 2021  1:19 PM. If you have any questions, ask your nurse or doctor.          levothyroxine 137 MCG tablet Commonly known as: SYNTHROID Take 1 tablet (137 mcg total) by mouth daily before breakfast.   lisinopril 5 MG tablet Commonly known as: ZESTRIL lisinopril 5 mg tablet  Take 1 tablet every day by oral route.   Vitamin D 50 MCG (2000 UT) Caps Take 3 capsules (6,000 Units total) by mouth daily.          OBJECTIVE:   PHYSICAL EXAM: VS: BP (!) 140/80 (BP Location: Left Arm, Patient Position: Sitting, Cuff Size: Large)   Pulse 88   Ht 5' 8.5" (1.74 m)   Wt 204 lb (92.5 kg)   SpO2 96%   BMI 30.57 kg/m    EXAM: General: Pt appears well and is in NAD  Eyes: External eye exam normal without stare, lid lag or exophthalmos.  EOM  intact.    Neck: General: Supple without adenopathy. Thyroid: Thyroid size normal.  No goiter or nodules appreciated. No thyroid bruit.  Lungs: Clear with good BS bilat with no rales, rhonchi, or wheezes  Heart: Auscultation: RRR.  Abdomen: Normoactive bowel sounds, soft, nontender, without masses or organomegaly palpable  Extremities:  BL LE: No pretibial edema normal ROM and strength.  Mental Status: Judgment, insight: Intact Orientation: Oriented to time, place, and person Mood and affect: No depression, anxiety, or agitation     DATA REVIEWED:   Latest Reference Range & Units 05/04/21 09:10  TSH 0.35 -  5.50 uIU/mL 0.28 (L)  T4,Free(Direct) 0.60 - 1.60 ng/dL 1.19      Thyroid pathology 01/05/2016  Specimen: Total thyroid. Procedure (including lymph node sampling if applicable): Total thyroidectomy. Specimen Integrity (intact/fragmented): Intact. Tumor focality: Unifocal. Dominant tumor: Maximum tumor size (cm): 0.8 cm Tumor laterality: right lower pole Histologic type (including subtype and/or unique features as applicable): Papillary carcinoma, classic/usual type. Tumor capsule: N/A. Extrathyroidal extension: Not identified. Capsular invasion with degree of invasion if present: N/A. Margins: Negative. Lymphatic or vascular invasion: Not identified. Lymph nodes: # examined 2; # positive; 0 Extracapsular extension (if applicable): N/A. TNM code: pT1a, pN0 Non-neoplastic thyroid: Nodular hyperplasia, lymphocytic thyroiditis.    Old records , labs and images have been reviewed.    ASSESSMENT / PLAN / RECOMMENDATIONS:   Postoperative Hypothyroidism   -Patient is clinically euthyroid -He had TSH 2 weeks ago at PCPs office with low TSH, patient unable to recall the actual number, patient signed ROI to obtain these records -Given history of low TSH in the past, I will reduce his levothyroxine - Pt educated extensively on the correct way to take levothyroxine (first thing in the morning with water, 30 minutes before eating or taking other medications). - Pt encouraged to double dose the following day if she were to miss a dose given long half-life of levothyroxine.   Medications  Stop levothyroxine 137 mcg daily Start levothyroxine 125 mcg daily    2. Hx of papillary microcarcinoma of the thyroid:    -He is s/p total thyroidectomy in 2017.  This was classic PTC, unifocal, 0.8 cm with no lymph node metastases -Patient is at low risk for recurrence -We will check TG and TG AB on next lab work -No RAI postop -TSH goal 0.5-2 u IU/mL     Signed electronically  by: Mack Guise, MD  [facility]  [facility] [address] [place], [place] [address] Phone: [phone] FAX: [phone]      CC: Ardyth Gal, MD (Inactive) No address on file Phone: None  Fax: None   Return to Endocrinology clinic as below: No future appointments.

## 2021-12-13 ENCOUNTER — Ambulatory Visit (INDEPENDENT_AMBULATORY_CARE_PROVIDER_SITE_OTHER): Payer: Self-pay | Admitting: Internal Medicine

## 2021-12-13 ENCOUNTER — Encounter: Payer: Self-pay | Admitting: Internal Medicine

## 2021-12-13 VITALS — BP 140/80 | HR 88 | Ht 68.5 in | Wt 204.0 lb

## 2021-12-13 DIAGNOSIS — C73 Malignant neoplasm of thyroid gland: Secondary | ICD-10-CM

## 2021-12-13 DIAGNOSIS — E890 Postprocedural hypothyroidism: Secondary | ICD-10-CM

## 2021-12-13 MED ORDER — LEVOTHYROXINE SODIUM 125 MCG PO TABS: 125.0000 ug | ORAL_TABLET | Freq: Every day | ORAL | 3 refills | Status: DC

## 2021-12-13 NOTE — Patient Instructions (Signed)

## 2022-02-18 ENCOUNTER — Other Ambulatory Visit: Payer: Self-pay

## 2022-02-25 ENCOUNTER — Other Ambulatory Visit (INDEPENDENT_AMBULATORY_CARE_PROVIDER_SITE_OTHER): Payer: Self-pay

## 2022-02-25 DIAGNOSIS — C73 Malignant neoplasm of thyroid gland: Secondary | ICD-10-CM

## 2022-02-25 LAB — TSH: TSH: 0.3 u[IU]/mL — ABNORMAL LOW (ref 0.35–5.50)

## 2022-02-27 LAB — THYROGLOBULIN LEVEL: Thyroglobulin: 1.3 ng/mL — ABNORMAL LOW

## 2022-02-27 LAB — THYROGLOBULIN ANTIBODY: Thyroglobulin Ab: 2 IU/mL — ABNORMAL HIGH (ref <=1)

## 2022-03-14 ENCOUNTER — Ambulatory Visit: Payer: Self-pay | Admitting: Internal Medicine

## 2022-06-14 ENCOUNTER — Encounter: Payer: Self-pay | Admitting: Internal Medicine

## 2022-06-14 ENCOUNTER — Ambulatory Visit (INDEPENDENT_AMBULATORY_CARE_PROVIDER_SITE_OTHER): Payer: Self-pay | Admitting: Internal Medicine

## 2022-06-14 VITALS — BP 124/86 | HR 72 | Ht 68.0 in | Wt 202.0 lb

## 2022-06-14 DIAGNOSIS — C73 Malignant neoplasm of thyroid gland: Secondary | ICD-10-CM

## 2022-06-14 DIAGNOSIS — E890 Postprocedural hypothyroidism: Secondary | ICD-10-CM

## 2022-06-14 LAB — TSH: TSH: 0.63 u[IU]/mL (ref 0.35–5.50)

## 2022-06-14 NOTE — Progress Notes (Unsigned)
Name: Stephen Benson  MRN/ DOB: [id number], [birth year]    Age/ Sex: 32 y.o., male     PCP: Jonny Ruiz, MD (Inactive)   Reason for Endocrinology Evaluation: Postoperative hypothyroidism     Initial Endocrinology Clinic Visit: 06/27/2015    PATIENT IDENTIFIER: Stephen Benson is a 36 y.o., male with a past medical history of postoperative hypothyroidism. He has followed with [facility] since 4/18/20217 for consultative assistance with management of his postoperative hypothyroidism.   HISTORICAL SUMMARY: The patient was first diagnosed with hyperthyroidism secondary to Graves' Disease in 2017. Pt was on Methimazole temporarily until her thyroid surgery.  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  Pt has been on LT-4 replacement since his thyroid sx   He was followed by Dr. Everardo All from 2017 until 04/2021  SUBJECTIVE:    Today (06/14/2022):  Stephen Benson is here for Hx of PTC and postoperative hypothyroidism   Weight has been stable  Denies local neck swelling , no dysphagia  Denies constipation or diarrhea Denies palpitations  Denies tremors    Levothyroxine 125 mcg daily      HISTORY:  Past Medical History:  Past Medical History:  Diagnosis Date   Dyspnea    with exertion   Primary hyperthyroidism    Past Surgical History:  Past Surgical History:  Procedure Laterality Date   MASS BIOPSY N/A 01/05/2016   Procedure: Neck Wound Exploration; control of Bleeders;  Surgeon: Claud Kelp, MD;  Location: [facility];  Service: General;  Laterality: N/A;  Neck Wound Exploration; control of Bleeders   NO PAST SURGERIES     THYROIDECTOMY N/A 01/05/2016   Procedure: TOTAL THYROIDECTOMY;   Surgeon: Axel Filler, MD;  Location: [facility];  Service: General;  Laterality: N/A;   Social History:  reports that he has never smoked. He has never used smokeless tobacco. He reports current alcohol use. He reports current drug use. Drug: Marijuana. Family History:  Family History  Problem Relation Age of Onset   Hyperthyroidism Other      HOME MEDICATIONS: Allergies as of 06/14/2022   No Known Allergies      Medication List        Accurate as of June 14, 2022  1:10 PM. If you have any questions, ask your nurse or doctor.          levothyroxine 125 MCG tablet Commonly known as: SYNTHROID Take 1 tablet (125 mcg total) by mouth daily before breakfast.   lisinopril 5 MG tablet Commonly known as: ZESTRIL lisinopril 5 mg tablet  Take 1 tablet every day by oral route.   Vitamin D 50 MCG (2000 UT) Caps Take 3 capsules (6,000 Units total) by mouth daily.          OBJECTIVE:   PHYSICAL EXAM: VS: BP 124/86   Pulse 72   Ht 5\' 8"  (1.727 m)   Wt 202 lb (91.6 kg)   SpO2 96%   BMI 30.71 kg/m    EXAM: General: Pt appears well and is in NAD  Eyes: External eye exam normal without stare, lid lag or exophthalmos.  EOM intact.    Neck: General: Supple without adenopathy. Thyroid: Thyroid size normal.  No goiter or nodules appreciated. No thyroid bruit.  Lungs:  Clear with good BS bilat with no rales, rhonchi, or wheezes  Heart: Auscultation: RRR.  Abdomen: Normoactive bowel sounds, soft, nontender, without masses or organomegaly palpable  Extremities:  BL LE: No pretibial edema normal ROM and strength.  Mental Status: Judgment, insight: Intact Orientation: Oriented to time, place, and person Mood and affect: No depression, anxiety, or agitation     DATA REVIEWED:    Latest Reference Range & Units 02/25/22 09:53  TSH 0.35 - 5.50 uIU/mL 0.30 (L)  Thyroglobulin ng/mL 1.3 (L)  Thyroglobulin Ab < or = 1 IU/mL 2 (H)       Thyroid pathology  01/05/2016  Specimen: Total thyroid. Procedure (including lymph node sampling if applicable): Total thyroidectomy. Specimen Integrity (intact/fragmented): Intact. Tumor focality: Unifocal. Dominant tumor: Maximum tumor size (cm): 0.8 cm Tumor laterality: right lower pole Histologic type (including subtype and/or unique features as applicable): Papillary carcinoma, classic/usual type. Tumor capsule: N/A. Extrathyroidal extension: Not identified. Capsular invasion with degree of invasion if present: N/A. Margins: Negative. Lymphatic or vascular invasion: Not identified. Lymph nodes: # examined 2; # positive; 0 Extracapsular extension (if applicable): N/A. TNM code: pT1a, pN0 Non-neoplastic thyroid: Nodular hyperplasia, lymphocytic thyroiditis.    Old records , labs and images have been reviewed.    ASSESSMENT / PLAN / RECOMMENDATIONS:   Postoperative Hypothyroidism   -Patient is clinically euthyroid -He had TSH 2 weeks ago at PCPs office with low TSH, patient unable to recall the actual number, patient signed ROI to obtain these records -Given history of low TSH in the past, I will reduce his levothyroxine - Pt educated extensively on the correct way to take levothyroxine (first thing in the morning with water, 30 minutes before eating or taking other medications). - Pt encouraged to double dose the following day if she were to miss a dose given long half-life of levothyroxine.   Medications   Start levothyroxine 125 mcg daily    2. Hx of papillary microcarcinoma of the thyroid:    -He is s/p total thyroidectomy in 2017.  This was classic PTC, unifocal, 0.8 cm with no lymph node metastases -Patient is at low risk for recurrence -We will check TG and TG AB on next lab work -No RAI postop -TSH goal 0.5-2 u IU/mL     Signed electronically by: Lyndle HerrlichAbby Jaralla Stephen Stettner, MD  [facility]  [facility] [address] [place],  [place] [address] Phone: [phone] FAX: [phone]      CC: Jonny RuizLlibre, Giovanni, MD (Inactive) No address on file Phone: None  Fax: None   Return to Endocrinology clinic as below: No future appointments.

## 2022-06-14 NOTE — Patient Instructions (Signed)

## 2022-06-17 MED ORDER — LEVOTHYROXINE SODIUM 125 MCG PO TABS: 125.0000 ug | ORAL_TABLET | Freq: Every day | ORAL | 3 refills | Status: DC

## 2023-06-16 ENCOUNTER — Ambulatory Visit: Payer: Self-pay | Admitting: Internal Medicine

## 2023-06-30 ENCOUNTER — Ambulatory Visit: Payer: Self-pay | Admitting: Internal Medicine

## 2023-06-30 NOTE — Progress Notes (Deleted)
 Name: Stephen Benson  MRN/ DOB: [id number], 1990-04-07    Age/ Sex: 33 y.o., male     PCP: Caesar Caster, MD (Inactive)   Reason for Endocrinology Evaluation: Postoperative hypothyroidism     Initial Endocrinology Clinic Visit: 06/27/2015    PATIENT IDENTIFIER: Stephen Benson is a 18 y.o., male with a past medical history of postoperative hypothyroidism. He has followed with [facility] since 4/18/20217 for consultative assistance with management of his postoperative hypothyroidism.   HISTORICAL SUMMARY: The patient was first diagnosed with hyperthyroidism secondary to Graves' Disease in 2017. Pt was on Methimazole  temporarily until her thyroid  surgery.  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  Pt has been on LT-4 replacement since his thyroid  sx   He was followed by Dr. Washington Hacker from 2017 until 04/2021  SUBJECTIVE:    Today (06/30/2023):  Mr. [name] is here for Hx of PTC and postoperative hypothyroidism   Weight has been stable  Denies local neck swelling , no dysphagia  Denies constipation or diarrhea Denies palpitations  Denies tremors    Levothyroxine  125 mcg daily      HISTORY:  Past Medical History:  Past Medical History:  Diagnosis Date   Dyspnea    with exertion   Primary hyperthyroidism    Past Surgical History:  Past Surgical History:  Procedure Laterality Date   MASS BIOPSY N/A 01/05/2016   Procedure: Neck Wound Exploration; control of Bleeders;  Surgeon: Boyce Byes, MD;  Location: [facility] OR;  Service: General;  Laterality: N/A;  Neck Wound Exploration; control of Bleeders   NO PAST SURGERIES     THYROIDECTOMY N/A 01/05/2016   Procedure: TOTAL  THYROIDECTOMY;  Surgeon: Shela Derby, MD;  Location: [facility] OR;  Service: General;  Laterality: N/A;   Social History:  reports that he has never smoked. He has never used smokeless tobacco. He reports current alcohol use. He reports current drug use. Drug: Marijuana. Family History:  Family History  Problem Relation Age of Onset   Hyperthyroidism Other      HOME MEDICATIONS: Allergies as of 06/30/2023   No Known Allergies      Medication List        Accurate as of June 30, 2023  7:10 AM. If you have any questions, ask your nurse or doctor.          levothyroxine  125 MCG tablet Commonly known as: SYNTHROID  Take 1 tablet (125 mcg total) by mouth daily before breakfast.   lisinopril 5 MG tablet Commonly known as: ZESTRIL lisinopril 5 mg tablet  Take 1 tablet every day by oral route.   Vitamin D  50 MCG (2000 UT) Caps Take 3 capsules (6,000 Units total) by mouth daily.          OBJECTIVE:   PHYSICAL EXAM: VS: There were no vitals taken for this visit.   EXAM: General: Pt appears well and is in NAD  Eyes: External eye exam normal without stare, lid lag or exophthalmos.  EOM intact.    Neck: General: Supple without adenopathy. Thyroid : Thyroid  size normal.  No goiter or nodules appreciated. No thyroid  bruit.  Lungs: Clear with good BS bilat with no rales, rhonchi, or wheezes  Heart: Auscultation: RRR.  Abdomen: Normoactive bowel sounds, soft, nontender,  without masses or organomegaly palpable  Extremities:  BL LE: No pretibial edema normal ROM and strength.  Mental Status: Judgment, insight: Intact Orientation: Oriented to time, place, and person Mood and affect: No depression, anxiety, or agitation     DATA REVIEWED:  Latest Reference Range & Units 06/14/22 13:27  TSH 0.35 - 5.50 uIU/mL 0.63     Latest Reference Range & Units 02/25/22 09:53  TSH 0.35 - 5.50 uIU/mL 0.30 (L)  Thyroglobulin ng/mL 1.3 (L)  Thyroglobulin Ab < or = 1 IU/mL 2 (H)        Thyroid  pathology 01/05/2016  Specimen: Total thyroid . Procedure (including lymph node sampling if applicable): Total thyroidectomy. Specimen Integrity (intact/fragmented): Intact. Tumor focality: Unifocal. Dominant tumor: Maximum tumor size (cm): 0.8 cm Tumor laterality: right lower pole Histologic type (including subtype and/or unique features as applicable): Papillary carcinoma, classic/usual type. Tumor capsule: N/A. Extrathyroidal extension: Not identified. Capsular invasion with degree of invasion if present: N/A. Margins: Negative. Lymphatic or vascular invasion: Not identified. Lymph nodes: # examined 2; # positive; 0 Extracapsular extension (if applicable): N/A. TNM code: pT1a, pN0 Non-neoplastic thyroid : Nodular hyperplasia, lymphocytic thyroiditis.    Old records , labs and images have been reviewed.    ASSESSMENT / PLAN / RECOMMENDATIONS:   Postoperative Hypothyroidism   -Patient is clinically euthyroid - Pt educated extensively on the correct way to take levothyroxine  (first thing in the morning with water, 30 minutes before eating or taking other medications). - Pt encouraged to double dose the following day if she were to miss a dose given long half-life of levothyroxine . - TSH normalized   Medications   Continue  levothyroxine  125 mcg daily    2. Hx of papillary microcarcinoma of the thyroid :    -He is s/p total thyroidectomy in 2017.  This was classic PTC, unifocal, 0.8 cm with no lymph node metastases -Patient is at low risk for recurrence - Tg is low but detectable at 1.3 ng/mL with elevated Tg Ab  -No RAI postop -TSH goal 0.5-2 u IU/mL     Signed electronically by: Natale Bail, MD  [facility] Endocrinology  [facility] [address] [address] [place], [place] [address] Phone: [phone] FAX: [phone]      CC: Caesar Caster, MD (Inactive) No address on file Phone: None  Fax:  None   Return to Endocrinology clinic as below: Future Appointments  Date Time Provider Department Center  06/30/2023  8:50 AM Eean Buss, Julian Obey, MD [id number] None

## 2023-07-14 ENCOUNTER — Ambulatory Visit (INDEPENDENT_AMBULATORY_CARE_PROVIDER_SITE_OTHER): Payer: Self-pay | Admitting: Internal Medicine

## 2023-07-14 ENCOUNTER — Encounter: Payer: Self-pay | Admitting: Internal Medicine

## 2023-07-14 VITALS — BP 124/78 | HR 76 | Ht 68.0 in | Wt 190.0 lb

## 2023-07-14 DIAGNOSIS — C73 Malignant neoplasm of thyroid gland: Secondary | ICD-10-CM

## 2023-07-14 DIAGNOSIS — E890 Postprocedural hypothyroidism: Secondary | ICD-10-CM

## 2023-07-14 NOTE — Progress Notes (Unsigned)
 Name: Stephen Benson  MRN/ DOB: [id number], October 01, 1990    Age/ Sex: 33 y.o., male     PCP: Pcp, No   Reason for Endocrinology Evaluation: Postoperative hypothyroidism     Initial Endocrinology Clinic Visit: 06/27/2015    PATIENT IDENTIFIER: Stephen Benson is a 33 y.o., male with a past medical history of postoperative hypothyroidism. He has followed with [facility] since 4/18/20217 for consultative assistance with management of his postoperative hypothyroidism.   HISTORICAL SUMMARY: The patient was first diagnosed with hyperthyroidism secondary to Graves' Disease in 2017. Pt was on Methimazole  temporarily until her thyroid  surgery.  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  Pt has been on LT-4 replacement since his thyroid  sx   He was followed by Dr. Washington Hacker from 2017 until 04/2021  SUBJECTIVE:    Today (07/14/2023):  Stephen Benson is here for Hx of PTC and postoperative hypothyroidism.  Denies local neck swelling , has occasional  difficulty with slow swallowing  Denies constipation or diarrhea Denies palpitations  Denies tremors    Levothyroxine  125 mcg daily      HISTORY:  Past Medical History:  Past Medical History:  Diagnosis Date   Dyspnea    with exertion   Primary hyperthyroidism    Past Surgical History:  Past Surgical History:  Procedure Laterality Date   MASS BIOPSY N/A 01/05/2016   Procedure: Neck Wound Exploration; control of Bleeders;  Surgeon: Boyce Byes, MD;  Location: [facility] OR;  Service: General;  Laterality: N/A;  Neck Wound Exploration; control of Bleeders   NO PAST SURGERIES     THYROIDECTOMY N/A 01/05/2016   Procedure: TOTAL THYROIDECTOMY;  Surgeon:  Shela Derby, MD;  Location: [facility] OR;  Service: General;  Laterality: N/A;   Social History:  reports that he has never smoked. He has never used smokeless tobacco. He reports current alcohol use. He reports current drug use. Drug: Marijuana. Family History:  Family History  Problem Relation Age of Onset   Hyperthyroidism Other      HOME MEDICATIONS: Allergies as of 07/14/2023   No Known Allergies      Medication List        Accurate as of Jul 14, 2023 10:52 AM. If you have any questions, ask your nurse or doctor.          levothyroxine  125 MCG tablet Commonly known as: SYNTHROID  Take 1 tablet (125 mcg total) by mouth daily before breakfast.   lisinopril 5 MG tablet Commonly known as: ZESTRIL   Vitamin D  50 MCG (2000 UT) Caps Take 3 capsules (6,000 Units total) by mouth daily.          OBJECTIVE:   PHYSICAL EXAM: VS: BP 124/78 (BP Location: Left Arm, Patient Position: Sitting, Cuff Size: Normal)   Pulse 76   Ht 5\' 8"  (1.727 m)   Wt 190 lb (86.2 kg)   SpO2 99%   BMI 28.89 kg/m    EXAM: General: Pt appears well and is in NAD  Neck: General: Supple without adenopathy. Thyroid : Thyroid  size normal.  No goiter or nodules appreciated.  Lungs: Clear with good BS bilat   Heart: Auscultation: RRR.  Abdomen: soft, nontender  Extremities:  BL LE: No pretibial edema normal   Mental Status:  Judgment, insight: Intact Orientation: Oriented to time, place, and person Mood and affect: No depression, anxiety, or agitation     DATA REVIEWED:  Latest Reference Range & Units 07/14/23 11:05  TSH 0.40 - 4.50 mIU/L 0.28 (L)     Latest Reference Range & Units 02/25/22 09:53  Thyroglobulin ng/mL 1.3 (L)  Thyroglobulin Ab < or = 1 IU/mL 2 (H)     Thyroid  pathology 01/05/2016  Specimen: Total thyroid . Procedure (including lymph node sampling if applicable): Total thyroidectomy. Specimen Integrity (intact/fragmented): Intact. Tumor focality: Unifocal. Dominant  tumor: Maximum tumor size (cm): 0.8 cm Tumor laterality: right lower pole Histologic type (including subtype and/or unique features as applicable): Papillary carcinoma, classic/usual type. Tumor capsule: N/A. Extrathyroidal extension: Not identified. Capsular invasion with degree of invasion if present: N/A. Margins: Negative. Lymphatic or vascular invasion: Not identified. Lymph nodes: # examined 2; # positive; 0 Extracapsular extension (if applicable): N/A. TNM code: pT1a, pN0 Non-neoplastic thyroid : Nodular hyperplasia, lymphocytic thyroiditis.    Old records , labs and images have been reviewed.    ASSESSMENT / PLAN / RECOMMENDATIONS:   Postoperative Hypothyroidism   -Patient is clinically euthyroid - Pt educated extensively on the correct way to take levothyroxine  (first thing in the morning with water, 30 minutes before eating or taking other medications). - Pt encouraged to double dose the following day if she were to miss a dose given long half-life of levothyroxine . - TSH is low, will decrease levothyroxine  as below  Medications   Change levothyroxine  125 mcg , half a tablet on Sundays and 1 tablet rest of the week    2. Hx of papillary microcarcinoma of the thyroid :    -He is s/p total thyroidectomy in 2017.  This was classic PTC, unifocal, 0.8 cm with no lymph node metastases -Patient is at low risk for recurrence - Tg is low but detectable at 1.3 ng/mL with elevated Tg Ab  -No RAI postop -TSH goal 0.5-2 u IU/mL     Signed electronically by: Natale Bail, MD  [facility] Endocrinology  [facility] [address] [place] [place], [place] [address] Phone: [phone] FAX: [phone]      CC: Pcp, No No address on file Phone: None  Fax: None   Return to Endocrinology clinic as below: No future appointments.

## 2023-07-14 NOTE — Patient Instructions (Signed)

## 2023-07-15 ENCOUNTER — Encounter: Payer: Self-pay | Admitting: Internal Medicine

## 2023-07-15 MED ORDER — LEVOTHYROXINE SODIUM 125 MCG PO TABS: 125.0000 ug | ORAL_TABLET | ORAL | 3 refills | Status: DC

## 2023-07-25 LAB — TSH: TSH: 0.28 m[IU]/L — ABNORMAL LOW (ref 0.40–4.50)

## 2023-07-25 LAB — THYROGLOBULIN, LC/MS/MS: THYROGLOBULIN, LC/MS/MS: 1.1 ng/mL — ABNORMAL HIGH

## 2023-07-28 ENCOUNTER — Ambulatory Visit: Payer: Self-pay | Admitting: Internal Medicine

## 2023-08-30 ENCOUNTER — Other Ambulatory Visit: Payer: Self-pay | Admitting: Internal Medicine

## 2023-08-30 DIAGNOSIS — C73 Malignant neoplasm of thyroid gland: Secondary | ICD-10-CM

## 2024-07-12 ENCOUNTER — Ambulatory Visit: Payer: Self-pay | Admitting: Internal Medicine
